# Patient Record
Sex: Female | Born: 1960
Health system: Southern US, Community
[De-identification: ages and names within clinical notes are randomized; demographics above are authoritative.]

## PROBLEM LIST (undated history)

## (undated) DIAGNOSIS — T7840XA Allergy, unspecified, initial encounter: Secondary | ICD-10-CM

## (undated) DIAGNOSIS — R011 Cardiac murmur, unspecified: Secondary | ICD-10-CM

## (undated) DIAGNOSIS — M1990 Unspecified osteoarthritis, unspecified site: Secondary | ICD-10-CM

## (undated) DIAGNOSIS — I10 Essential (primary) hypertension: Secondary | ICD-10-CM

## (undated) HISTORY — DX: Allergy, unspecified, initial encounter: T78.40XA

## (undated) HISTORY — DX: Unspecified osteoarthritis, unspecified site: M19.90

## (undated) HISTORY — DX: Cardiac murmur, unspecified: R01.1

## (undated) HISTORY — PX: ABDOMINAL HYSTERECTOMY: SHX81

## (undated) HISTORY — DX: Essential (primary) hypertension: I10

---

## 1992-11-06 HISTORY — PX: PARTIAL HYSTERECTOMY: SHX80

## 1993-11-06 HISTORY — PX: ABDOMINAL SURGERY: SHX537

## 1999-04-27 ENCOUNTER — Encounter: Payer: Self-pay | Admitting: Family Medicine

## 1999-04-27 ENCOUNTER — Ambulatory Visit (HOSPITAL_COMMUNITY): Admission: RE | Admit: 1999-04-27 | Discharge: 1999-04-27 | Payer: Self-pay | Admitting: Family Medicine

## 1999-05-31 ENCOUNTER — Other Ambulatory Visit: Admission: RE | Admit: 1999-05-31 | Discharge: 1999-05-31 | Payer: Self-pay | Admitting: *Deleted

## 2001-02-02 ENCOUNTER — Emergency Department (HOSPITAL_COMMUNITY): Admission: EM | Admit: 2001-02-02 | Discharge: 2001-02-03 | Payer: Self-pay | Admitting: Emergency Medicine

## 2001-02-03 ENCOUNTER — Encounter: Payer: Self-pay | Admitting: Emergency Medicine

## 2002-06-23 ENCOUNTER — Emergency Department (HOSPITAL_COMMUNITY): Admission: EM | Admit: 2002-06-23 | Discharge: 2002-06-23 | Payer: Self-pay | Admitting: Emergency Medicine

## 2002-06-23 ENCOUNTER — Encounter: Payer: Self-pay | Admitting: Emergency Medicine

## 2003-01-07 ENCOUNTER — Other Ambulatory Visit (HOSPITAL_COMMUNITY): Admission: RE | Admit: 2003-01-07 | Discharge: 2003-01-28 | Payer: Self-pay | Admitting: *Deleted

## 2003-12-31 ENCOUNTER — Emergency Department (HOSPITAL_COMMUNITY): Admission: EM | Admit: 2003-12-31 | Discharge: 2003-12-31 | Payer: Self-pay | Admitting: Family Medicine

## 2004-05-04 ENCOUNTER — Other Ambulatory Visit: Admission: RE | Admit: 2004-05-04 | Discharge: 2004-05-04 | Payer: Self-pay | Admitting: Family Medicine

## 2004-07-27 ENCOUNTER — Emergency Department (HOSPITAL_COMMUNITY): Admission: EM | Admit: 2004-07-27 | Discharge: 2004-07-27 | Payer: Self-pay | Admitting: Emergency Medicine

## 2004-11-06 HISTORY — PX: CERVICAL FUSION: SHX112

## 2005-04-28 ENCOUNTER — Ambulatory Visit (HOSPITAL_COMMUNITY): Admission: RE | Admit: 2005-04-28 | Discharge: 2005-04-29 | Payer: Self-pay | Admitting: Neurosurgery

## 2005-05-23 ENCOUNTER — Encounter: Admission: RE | Admit: 2005-05-23 | Discharge: 2005-05-23 | Payer: Self-pay | Admitting: Neurosurgery

## 2008-09-24 ENCOUNTER — Emergency Department (HOSPITAL_COMMUNITY): Admission: EM | Admit: 2008-09-24 | Discharge: 2008-09-24 | Payer: Self-pay | Admitting: Emergency Medicine

## 2008-11-06 HISTORY — PX: FOOT TUMOR EXCISION: SUR566

## 2009-02-11 ENCOUNTER — Emergency Department (HOSPITAL_COMMUNITY): Admission: EM | Admit: 2009-02-11 | Discharge: 2009-02-11 | Payer: Self-pay | Admitting: Family Medicine

## 2009-04-22 ENCOUNTER — Encounter: Admission: RE | Admit: 2009-04-22 | Discharge: 2009-04-22 | Payer: Self-pay | Admitting: Podiatry

## 2009-06-22 ENCOUNTER — Other Ambulatory Visit: Admission: RE | Admit: 2009-06-22 | Discharge: 2009-06-22 | Payer: Self-pay | Admitting: Family Medicine

## 2009-10-12 ENCOUNTER — Encounter: Admission: RE | Admit: 2009-10-12 | Discharge: 2009-10-12 | Payer: Self-pay | Admitting: Family Medicine

## 2011-01-15 ENCOUNTER — Emergency Department (HOSPITAL_BASED_OUTPATIENT_CLINIC_OR_DEPARTMENT_OTHER)
Admission: EM | Admit: 2011-01-15 | Discharge: 2011-01-15 | Disposition: A | Discharge status: Home / Self Care | Payer: 59 | Attending: Emergency Medicine | Admitting: Emergency Medicine

## 2011-01-15 DIAGNOSIS — F17200 Nicotine dependence, unspecified, uncomplicated: Secondary | ICD-10-CM | POA: Insufficient documentation

## 2011-01-15 DIAGNOSIS — K122 Cellulitis and abscess of mouth: Secondary | ICD-10-CM | POA: Insufficient documentation

## 2011-01-15 DIAGNOSIS — I10 Essential (primary) hypertension: Secondary | ICD-10-CM | POA: Insufficient documentation

## 2011-03-24 NOTE — Op Note (Signed)
NAME:  ITZELL, BENDAVID              ACCOUNT NO.:  [id number]   MEDICAL RECORD NO.:  [id number]          PATIENT TYPE:  OIB   LOCATION:  2864                         FACILITY:  [facility]   PHYSICIAN:  Reinaldo Meeker, M.D. DATE OF BIRTH:  01-22-1961   DATE OF PROCEDURE:  04/28/2005  DATE OF DISCHARGE:                                 OPERATIVE REPORT   PREOPERATIVE DIAGNOSIS:  Herniated disk at C3-4, C4-5, with spinal stenosis.   POSTOPERATIVE DIAGNOSIS:  Herniated disk at C3-4, C4-5, with spinal  stenosis.   PROCEDURE:  C3-4, C4-5 anterior cervical diskectomy with bone bank fusion,  followed by [employer] anterior cervical plating.   SURGEON:  Reinaldo Meeker, M.D.   ASSISTANT:  Donalee Citrin, M.D.   PROCEDURE IN DETAIL:  After being placed in a supine position in 10 pounds  of halter traction, the patient's neck was prepped and draped in the usual  sterile fashion.  Localizing fluoroscopy was used prior to incision to  identify the appropriate level.  A transverse incision was made in the right  anterior neck starting at the midline and headed toward the medial aspect of  the sternocleidomastoid muscle.  The platysma muscle was then incised  transversely.  The natural fascial plane between the strap muscles medially  and the sternocleidomastoid laterally was identified and followed down to  the anterior aspect of the cervical spine.  The longus colli muscles were  identified and split in the midline, dissected away bilaterally with Kitner  dissector and unipolar coagulation.  A self-retaining retractor was placed  for exposure, and the neck revealed approach to the appropriate levels.  Using a 15 blade, the annulus of the disk at C3-4 and C4-5 was incised.  Using pituitary rongeurs and curettes, approximately 90% of the disk  material was removed at both levels.  A high-speed drill was used to widen  the interspaces.  At this time the microscope was draped, brought into the  field and used for the remainder of the case.  Starting at C3-4 using  microdissection technique, the remainder of disk material down to the  posterior longitudinal ligament was removed.  The ligament was incised  transversely and the cut edge removed with the Kerrison punch.  Large  amounts of herniated disk material and bony overgrowth were removed to take  care of the patient's spinal stenosis at this level.  Proximal foraminal  decompression was carried out bilaterally.  At this time inspection was  carried out at this level for any evidence of residual compression, and none  was identified.  A similar procedure was then carried out at C4-5, once  again removing the remainder of the disk material down to the posterior  longitudinal ligament, removing it in a posterior fashion.  Once again  generous spinal and foraminal decompression was carried out until there was  no evidence of further compression.  At this time inspection was carried out  at both levels for any evidence of residual compression, and none could be  identified.  Large amounts of irrigation were carried out.  Any bleeding was  controlled  with bipolar coagulation and Gelfoam.  Measurements were taken  and two 6 mm bone bank plugs reconstituted.  After irrigating once more and  confirming hemostasis, the plug was impacted without difficulty.  Fluoroscopy showed it to be in good position.  An appropriate-length [employer] anterior cervical plate was then chosen.  Under fluoroscopic  guidance drill holes were placed, followed by placement of 14 mm screws x6.  These were placed good position, which was confirmed at the end with  fluoroscopy, which showed the plates, screws and plug to all be in good  position.  Again large amounts of irrigation were carried out, any bleeding  controlled with bipolar coagulation.  The wound was then closed using  interrupted Vicryl on the platysma muscle, inverted 5-0 PDS in the subcu   layer and Steri-Strips on the skin.  A sterile dressing and a soft collar  were applied, and the patient was extubated and taken to the recovery room  in stable condition.       ROK/MEDQ  D:  04/28/2005  T:  04/29/2005  Job:  [id number]

## 2011-08-08 LAB — CBC
MCHC: 33.7
MCV: 90.2
Platelets: 170
RDW: 13.8

## 2011-08-08 LAB — POCT I-STAT, CHEM 8
Calcium, Ion: 1.19
Chloride: 104
Glucose, Bld: 109 — ABNORMAL HIGH
Hemoglobin: 13.6
Potassium: 3.9
Sodium: 139

## 2011-08-08 LAB — DIFFERENTIAL
Basophils Absolute: 0
Basophils Relative: 0
Eosinophils Absolute: 0.1
Neutrophils Relative %: 72

## 2012-02-09 ENCOUNTER — Encounter: Payer: Self-pay | Admitting: Internal Medicine

## 2012-03-12 ENCOUNTER — Ambulatory Visit (AMBULATORY_SURGERY_CENTER): Payer: 59 | Admitting: *Deleted

## 2012-03-12 VITALS — Ht 66.5 in | Wt 225.0 lb

## 2012-03-12 DIAGNOSIS — Z1211 Encounter for screening for malignant neoplasm of colon: Secondary | ICD-10-CM

## 2012-03-12 MED ORDER — PEG-KCL-NACL-NASULF-NA ASC-C 100 G PO SOLR: ORAL | Status: DC

## 2012-03-13 ENCOUNTER — Encounter: Payer: Self-pay | Admitting: Internal Medicine

## 2012-03-26 ENCOUNTER — Encounter: Payer: Self-pay | Admitting: Internal Medicine

## 2012-03-26 ENCOUNTER — Ambulatory Visit (AMBULATORY_SURGERY_CENTER): Payer: 59 | Admitting: Internal Medicine

## 2012-03-26 VITALS — BP 126/77 | HR 70 | Temp 98.3°F | Resp 16 | Ht 66.0 in | Wt 225.0 lb

## 2012-03-26 DIAGNOSIS — D126 Benign neoplasm of colon, unspecified: Secondary | ICD-10-CM

## 2012-03-26 DIAGNOSIS — Z1211 Encounter for screening for malignant neoplasm of colon: Secondary | ICD-10-CM

## 2012-03-26 MED ORDER — SODIUM CHLORIDE 0.9 % IV SOLN: 500.0000 mL | INTRAVENOUS | Status: DC

## 2012-03-26 NOTE — Progress Notes (Signed)
Patient did not experience any of the following events: a burn prior to discharge; a fall within the facility; wrong site/side/patient/procedure/implant event; or a hospital transfer or hospital admission upon discharge from the facility. (G8907) Patient did not have preoperative order for IV antibiotic SSI prophylaxis. (G8918)  

## 2012-03-26 NOTE — Op Note (Signed)
[facility] [address] [place], [place]  [address]  COLONOSCOPY PROCEDURE REPORT  PATIENT:  Veronica Carter, Veronica Carter  MR#:  [id number] BIRTHDATE:  1960-11-26, 51 yrs. old  GENDER:  female ENDOSCOPIST:  Hedwig Morton. Juanda Chance, MD REF. BY:  Elias Else, M.D. PROCEDURE DATE:  03/26/2012 PROCEDURE:  Colonoscopy with biopsy ASA CLASS:  Class III INDICATIONS:  colorectal cancer screening, average risk MEDICATIONS:   MAC sedation, administered by CRNA, propofol (Diprivan) 300 mg  DESCRIPTION OF PROCEDURE:   After the risks and benefits and of the procedure were explained, informed consent was obtained. Digital rectal exam was performed and revealed no rectal masses. The LB CF-H180AL [id number] endoscope was introduced through the anus and advanced to the cecum, which was identified by the ileocecal valve.  The quality of the prep was good, using MoviPrep.  The instrument was then slowly withdrawn as the colon was fully examined. <<PROCEDUREIMAGES>>  FINDINGS:  A sessile polyp was found. 5 mm sessile poly distal from the ileocecal valve The polyp was removed using cold biopsy forceps (see image2 and image1).  This was otherwise a normal examination of the colon (see image7, image6, and image3). Retroflexed views in the rectum revealed no abnormalities.    The scope was then withdrawn from the patient and the procedure completed.  COMPLICATIONS:  None ENDOSCOPIC IMPRESSION: 1) Sessile polyp 2) Otherwise normal examination RECOMMENDATIONS: 1) Await pathology results 2) High fiber diet.  REPEAT EXAM:  In 10 year(s) for.  10 year recall if polyp hyperplastic, otherwise 5 year  ______________________________ Hedwig Morton. Juanda Chance, MD  CC:  n. eSIGNED:   Hedwig Morton. Aldena Worm at 03/26/2012 12:24 PM  Tommi Emery, [phone]

## 2012-03-26 NOTE — Progress Notes (Signed)
1214 Vomited small amount clear secretions pt. Suctioned, oxygen level decreased to 72%, oxygen increased to 5 liters and oxygen level increased to 100%  Above started after 1213 after abdominal pressure applied and pt. Repositioned to back.

## 2012-03-26 NOTE — Patient Instructions (Signed)

## 2012-03-27 ENCOUNTER — Telehealth: Payer: Self-pay | Admitting: *Deleted

## 2012-03-27 NOTE — Telephone Encounter (Signed)
  Follow up Call-  Call back number 03/26/2012  Post procedure Call Back phone  # [phone]  Permission to leave phone message Yes     Patient questions:  Do you have a fever, pain , or abdominal swelling? no Pain Score  0 *  Have you tolerated food without any problems? yes  Have you been able to return to your normal activities? yes  Do you have any questions about your discharge instructions: Diet   no Medications  no Follow up visit  no  Do you have questions or concerns about your Care? no  Actions: * If pain score is 4 or above: No action needed, pain <4.

## 2012-04-02 ENCOUNTER — Encounter: Payer: Self-pay | Admitting: Internal Medicine

## 2014-04-21 ENCOUNTER — Emergency Department (HOSPITAL_BASED_OUTPATIENT_CLINIC_OR_DEPARTMENT_OTHER): Payer: No Typology Code available for payment source

## 2014-04-21 ENCOUNTER — Encounter (HOSPITAL_BASED_OUTPATIENT_CLINIC_OR_DEPARTMENT_OTHER): Payer: Self-pay | Admitting: Emergency Medicine

## 2014-04-21 ENCOUNTER — Emergency Department (HOSPITAL_BASED_OUTPATIENT_CLINIC_OR_DEPARTMENT_OTHER)
Admission: EM | Admit: 2014-04-21 | Discharge: 2014-04-21 | Disposition: A | Discharge status: Home / Self Care | Payer: No Typology Code available for payment source | Attending: Emergency Medicine | Admitting: Emergency Medicine

## 2014-04-21 DIAGNOSIS — Z79899 Other long term (current) drug therapy: Secondary | ICD-10-CM | POA: Insufficient documentation

## 2014-04-21 DIAGNOSIS — I10 Essential (primary) hypertension: Secondary | ICD-10-CM | POA: Insufficient documentation

## 2014-04-21 DIAGNOSIS — R011 Cardiac murmur, unspecified: Secondary | ICD-10-CM | POA: Insufficient documentation

## 2014-04-21 DIAGNOSIS — J069 Acute upper respiratory infection, unspecified: Secondary | ICD-10-CM | POA: Insufficient documentation

## 2014-04-21 DIAGNOSIS — J40 Bronchitis, not specified as acute or chronic: Secondary | ICD-10-CM

## 2014-04-21 DIAGNOSIS — F17200 Nicotine dependence, unspecified, uncomplicated: Secondary | ICD-10-CM | POA: Insufficient documentation

## 2014-04-21 DIAGNOSIS — J209 Acute bronchitis, unspecified: Secondary | ICD-10-CM | POA: Insufficient documentation

## 2014-04-21 MED ORDER — DOXYCYCLINE HYCLATE 100 MG PO CAPS: 100.0000 mg | ORAL_CAPSULE | Freq: Two times a day (BID) | ORAL | Status: DC

## 2014-04-21 MED ORDER — BENZONATATE 200 MG PO CAPS: 200.0000 mg | ORAL_CAPSULE | Freq: Two times a day (BID) | ORAL | Status: DC | PRN

## 2014-04-21 MED ORDER — IBUPROFEN 800 MG PO TABS
800.0000 mg | ORAL_TABLET | Freq: Once | ORAL | Status: AC
  Administered 2014-04-21: 800 mg via ORAL
  Filled 2014-04-21: qty 1

## 2014-04-21 MED ORDER — IBUPROFEN 800 MG PO TABS: 800.0000 mg | ORAL_TABLET | Freq: Three times a day (TID) | ORAL | Status: AC | PRN

## 2014-04-21 MED ORDER — METOCLOPRAMIDE HCL 10 MG PO TABS
10.0000 mg | ORAL_TABLET | Freq: Once | ORAL | Status: AC
  Administered 2014-04-21: 10 mg via ORAL
  Filled 2014-04-21: qty 1

## 2014-04-21 MED ORDER — BENZONATATE 100 MG PO CAPS
200.0000 mg | ORAL_CAPSULE | Freq: Once | ORAL | Status: AC
  Administered 2014-04-21: 200 mg via ORAL
  Filled 2014-04-21: qty 2

## 2014-04-21 NOTE — ED Provider Notes (Signed)
CSN: [id number]     Arrival date & time 04/21/14  0841 History   First MD Initiated Contact with Patient 04/21/14 [phone]     Chief Complaint  Patient presents with  . URI     (Consider location/radiation/quality/duration/timing/severity/associated sxs/prior Treatment) Patient is a 53 y.o. female presenting with URI. The history is provided by the patient.  URI Presenting symptoms: congestion, cough and rhinorrhea   Presenting symptoms: no fever and no sore throat   Congestion:    Location:  Nasal and chest Cough:    Cough characteristics:  Non-productive   Severity:  Moderate   Onset quality:  Gradual   Duration:  3 weeks   Timing:  Constant   Progression:  Worsening Severity:  Mild Onset quality:  Gradual Duration:  3 weeks Timing:  Intermittent Progression:  Unchanged Chronicity:  New Relieved by:  Nothing Ineffective treatments: theraflu, home remedies. Associated symptoms: no wheezing   Risk factors: sick contacts (taking care of someone with pneumonia)     Past Medical History  Diagnosis Date  . Allergy     dust,pollen, grass, molds  . Heart murmur   . Hypertension    Past Surgical History  Procedure Laterality Date  . Partial hysterectomy  1994    Has one ovary  . Abdominal surgery  1995    to remove a benign tumor  . Cervical fusion  2006  . Foot tumor excision  2010    benign left  . Cesarean section  1978 &1984   History reviewed. No pertinent family history. History  Substance Use Topics  . Smoking status: Current Every Day Smoker -- 1.00 packs/day    Types: Cigarettes  . Smokeless tobacco: Never Used  . Alcohol Use: Yes     Comment: 1-2 a month   OB History   Grav Para Term Preterm Abortions TAB SAB Ect Mult Living                 Review of Systems  Constitutional: Negative for fever.  HENT: Positive for congestion and rhinorrhea. Negative for sore throat.   Respiratory: Positive for cough. Negative for shortness of breath and wheezing.    All other systems reviewed and are negative.     Allergies  Sulfa antibiotics; Codeine; Hydrocodone; Morphine and related; Tramadol; Tylenol; and Demerol  Home Medications   Prior to Admission medications   Medication Sig Start Date End Date Taking? Authorizing Provider  hydrochlorothiazide (MICROZIDE) 12.5 MG capsule Take 1 tablet by mouth Daily. 03/04/12   Historical Provider, MD   BP 161/85  Pulse 66  Temp(Src) 98.3 F (36.8 C) (Oral)  SpO2 100% Physical Exam  Nursing note and vitals reviewed. Constitutional: She is oriented to person, place, and time. She appears well-developed and well-nourished. No distress.  HENT:  Head: Normocephalic and atraumatic.  Right Ear: Tympanic membrane normal.  Left Ear: Tympanic membrane normal.  Mouth/Throat: Oropharynx is clear and moist. No oropharyngeal exudate.  Eyes: EOM are normal. Pupils are equal, round, and reactive to light.  Neck: Normal range of motion. Neck supple.  Cardiovascular: Normal rate and regular rhythm.  Exam reveals no friction rub.   No murmur heard. Pulmonary/Chest: Effort normal and breath sounds normal. No respiratory distress. She has no wheezes. She has no rales. She exhibits no tenderness.  Abdominal: Soft. She exhibits no distension. There is no tenderness. There is no rebound.  Musculoskeletal: Normal range of motion. She exhibits no edema.  Neurological: She is alert and oriented to  person, place, and time.  Skin: She is not diaphoretic.    ED Course  Procedures (including critical care time) Labs Review Labs Reviewed - No data to display  Imaging Review Dg Chest 2 View  04/21/2014   CLINICAL DATA:  Cough, congestion and sinus pressure. Mid chest pain for the past 3 weeks.  EXAM: CHEST  2 VIEW  COMPARISON:  No priors.  FINDINGS: Lung volumes are normal. No consolidative airspace disease. No pleural effusions. No pneumothorax. No pulmonary nodule or mass noted. Pulmonary vasculature and the  cardiomediastinal silhouette are within normal limits. Orthopedic fixation hardware noted in the lower cervical spine.  IMPRESSION: No radiographic evidence of acute cardiopulmonary disease.   Electronically Signed   By: Vinnie Langton M.D.   On: 04/21/2014 09:43     EKG Interpretation   Date/Time:  Tuesday April 21 2014 09:37:25 EDT Ventricular Rate:  60 PR Interval:  220 QRS Duration: 102 QT Interval:  402 QTC Calculation: 402 R Axis:   20 Text Interpretation:  Sinus rhythm with 1st degree A-V block Septal  infarct , age undetermined No prior for comparison Confirmed by [facility]   MD, BLAIR ([id number]) on 04/21/2014 9:43:16 AM      MDM   Final diagnoses:  Bronchitis  URI (upper respiratory infection)    55F with hx of smoking, HTN presents with 3 weeks of URI symptoms. Congestion, rhinorrhea, cough. Nonproductive cough, but feels like something is stuck in her chest. No difficulty with breathing, no wheezing. Chest pain gradually worsened over the 3 weeks with her coughing. Coughing my on exam, otherwise vitals stable, lungs clear. Denies trying NSAIDs, only tried aleve PM, which made her groggy and didn't help. She also tried theraflu and hmoe remedies without relief. Will xray, given motrin and reglan for her headache, bodyaches.  CXR negative. Given doxycycline for bronchitis, motrin for headaches, tessalon for cough. Advised on not allowing children to having tessalon. Instructed to pursue PCP f/u.  Osvaldo Shipper, MD 04/21/14 [phone]

## 2014-04-21 NOTE — ED Notes (Signed)
Pt with cough, headache, nasal congestion, neck soreness for three weeks without any fever.

## 2014-04-21 NOTE — Discharge Instructions (Signed)

## 2015-11-29 ENCOUNTER — Encounter (HOSPITAL_COMMUNITY): Payer: Self-pay | Admitting: Emergency Medicine

## 2015-11-29 ENCOUNTER — Emergency Department (INDEPENDENT_AMBULATORY_CARE_PROVIDER_SITE_OTHER)
Admission: EM | Admit: 2015-11-29 | Discharge: 2015-11-29 | Disposition: A | Discharge status: Home / Self Care | Payer: PRIVATE HEALTH INSURANCE | Source: Home / Self Care | Attending: Family Medicine | Admitting: Family Medicine

## 2015-11-29 DIAGNOSIS — E669 Obesity, unspecified: Secondary | ICD-10-CM

## 2015-11-29 DIAGNOSIS — R609 Edema, unspecified: Secondary | ICD-10-CM

## 2015-11-29 DIAGNOSIS — I10 Essential (primary) hypertension: Secondary | ICD-10-CM

## 2015-11-29 DIAGNOSIS — Z720 Tobacco use: Secondary | ICD-10-CM | POA: Diagnosis not present

## 2015-11-29 DIAGNOSIS — Z760 Encounter for issue of repeat prescription: Secondary | ICD-10-CM

## 2015-11-29 LAB — POCT I-STAT, CHEM 8
BUN: 9 mg/dL (ref 6–20)
CALCIUM ION: 1.18 mmol/L (ref 1.12–1.23)
CREATININE: 0.7 mg/dL (ref 0.44–1.00)
Chloride: 103 mmol/L (ref 101–111)
GLUCOSE: 100 mg/dL — AB (ref 65–99)
HCT: 47 % — ABNORMAL HIGH (ref 36.0–46.0)
HEMOGLOBIN: 16 g/dL — AB (ref 12.0–15.0)
POTASSIUM: 3.9 mmol/L (ref 3.5–5.1)
Sodium: 142 mmol/L (ref 135–145)
TCO2: 26 mmol/L (ref 0–100)

## 2015-11-29 MED ORDER — HYDROCHLOROTHIAZIDE 25 MG PO TABS: 25.0000 mg | ORAL_TABLET | Freq: Every day | ORAL | Status: AC

## 2015-11-29 NOTE — ED Notes (Signed)
Patient reports being out of medicine for two months.  Patient lost insurance.  Recently obtained insurance again.  Patient has noticed swelling in bilateral legs.  Denies chest pain.

## 2015-11-29 NOTE — ED Notes (Signed)
Patient's phlebotomy site was bleeding.  Removed dressing and applied a new dressing, secured with transpore tape.  Site has stopped bleeding.  Slight bruising around site.

## 2015-11-29 NOTE — Discharge Instructions (Signed)
There is a risk of taking medications prescribed by a health care provider without obtaining a complete history, labwork or proper physical exam, etc. This cannot be completed adequately at an urgent care. There can be multiple problems, some serious,  associated with medications and undetermined conditions of your health status. This action is performed as a last resort in order to supply you with medication. By receiving these prescriptions you are ackowleging and accepting these risks and will not hold the prescriber or any agent of [facility] as responsible for any adverse outcomes.    Medicine Refill at the Urgent Care We have refilled your medicine today, but it is best for you to get refills through your primary health care provider's office. In the future, please plan ahead so you do not need to get refills from the emergency department or Urgent Care. If the medicine we refilled was a maintenance medicine, you may have received only enough to get you by until you are able to see your regular health care provider.   This information is not intended to replace advice given to you by your health care provider. Make sure you discuss any questions you have with your health care provider.   Document Released: 02/09/2004 Document Revised: 11/13/2014 Document Reviewed: 01/30/2014 Elsevier Interactive Patient Education 2016 [employer].     Hypertension Hypertension, commonly called high blood pressure, is when the force of blood pumping through your arteries is too strong. Your arteries are the blood vessels that carry blood from your heart throughout your body. A blood pressure reading consists of a higher number over a lower number, such as 110/72. The higher number (systolic) is the pressure inside your arteries when your heart pumps. The lower number (diastolic) is the pressure inside your arteries when your heart relaxes. Ideally you want your blood pressure  below 120/80. Hypertension forces your heart to work harder to pump blood. Your arteries may become narrow or stiff. Having untreated or uncontrolled hypertension can cause heart attack, stroke, kidney disease, and other problems. RISK FACTORS Some risk factors for high blood pressure are controllable. Others are not.  Risk factors you cannot control include:   Race. You may be at higher risk if you are African American.  Age. Risk increases with age.  Gender. Men are at higher risk than women before age 93 years. After age 45, women are at higher risk than men. Risk factors you can control include:  Not getting enough exercise or physical activity.  Being overweight.  Getting too much fat, sugar, calories, or salt in your diet.  Drinking too much alcohol. SIGNS AND SYMPTOMS Hypertension does not usually cause signs or symptoms. Extremely high blood pressure (hypertensive crisis) may cause headache, anxiety, shortness of breath, and nosebleed. DIAGNOSIS To check if you have hypertension, your health care provider will measure your blood pressure while you are seated, with your arm held at the level of your heart. It should be measured at least twice using the same arm. Certain conditions can cause a difference in blood pressure between your right and left arms. A blood pressure reading that is higher than normal on one occasion does not mean that you need treatment. If it is not clear whether you have high blood pressure, you may be asked to return on a different day to have your blood pressure checked again. Or, you may be asked to monitor your blood pressure at home for 1 or more weeks.  TREATMENT Treating high blood pressure includes making lifestyle changes and possibly taking medicine. Living a healthy lifestyle can help lower high blood pressure. You may need to change some of your habits. Lifestyle changes may include:  Following the DASH diet. This diet is high in fruits,  vegetables, and whole grains. It is low in salt, red meat, and added sugars.  Keep your sodium intake below 2,300 mg per day.  Getting at least 30-45 minutes of aerobic exercise at least 4 times per week.  Losing weight if necessary.  Not smoking.  Limiting alcoholic beverages.  Learning ways to reduce stress. Your health care provider may prescribe medicine if lifestyle changes are not enough to get your blood pressure under control, and if one of the following is true:  You are 76-28 years of age and your systolic blood pressure is above 140.  You are 20 years of age or older, and your systolic blood pressure is above 150.  Your diastolic blood pressure is above 90.  You have diabetes, and your systolic blood pressure is over [id number] or your diastolic blood pressure is over 90.  You have kidney disease and your blood pressure is above 140/90.  You have heart disease and your blood pressure is above 140/90. Your personal target blood pressure may vary depending on your medical conditions, your age, and other factors. HOME CARE INSTRUCTIONS  Have your blood pressure rechecked as directed by your health care provider.   Take medicines only as directed by your health care provider. Follow the directions carefully. Blood pressure medicines must be taken as prescribed. The medicine does not work as well when you skip doses. Skipping doses also puts you at risk for problems.  Do not smoke.   Monitor your blood pressure at home as directed by your health care provider. SEEK MEDICAL CARE IF:   You think you are having a reaction to medicines taken.  You have recurrent headaches or feel dizzy.  You have swelling in your ankles.  You have trouble with your vision. SEEK IMMEDIATE MEDICAL CARE IF:  You develop a severe headache or confusion.  You have unusual weakness, numbness, or feel faint.  You have severe chest or abdominal pain.  You vomit repeatedly.  You have  trouble breathing. MAKE SURE YOU:   Understand these instructions.  Will watch your condition.  Will get help right away if you are not doing well or get worse.   This information is not intended to replace advice given to you by your health care provider. Make sure you discuss any questions you have with your health care provider.   Document Released: 10/23/2005 Document Revised: 03/09/2015 Document Reviewed: 08/15/2013 Elsevier Interactive Patient Education 2016 Elsevier Inc.   Peripheral Edema You have swelling in your legs (peripheral edema). This swelling is due to excess accumulation of salt and water in your body. Edema may be a sign of heart, kidney or liver disease, or a side effect of a medication. It may also be due to problems in the leg veins. Elevating your legs and using special support stockings may be very helpful, if the cause of the swelling is due to poor venous circulation. Avoid long periods of standing, whatever the cause. Treatment of edema depends on identifying the cause. Chips, pretzels, pickles and other salty foods should be avoided. Restricting salt in your diet is almost always needed. Water pills (diuretics) are often used to remove the excess salt and water from your body via urine.  These medicines prevent the kidney from reabsorbing sodium. This increases urine flow. Diuretic treatment may also result in lowering of potassium levels in your body. Potassium supplements may be needed if you have to use diuretics daily. Daily weights can help you keep track of your progress in clearing your edema. You should call your caregiver for follow up care as recommended. SEEK IMMEDIATE MEDICAL CARE IF:   You have increased swelling, pain, redness, or heat in your legs.  You develop shortness of breath, especially when lying down.  You develop chest or abdominal pain, weakness, or fainting.  You have a fever.   This information is not intended to replace advice  given to you by your health care provider. Make sure you discuss any questions you have with your health care provider.   Document Released: 11/30/2004 Document Revised: 01/15/2012 Document Reviewed: 05/05/2015 Elsevier Interactive Patient Education 2016 [employer].  Smoking Hazards Smoking cigarettes is extremely bad for your health. Tobacco smoke has over 200 known poisons in it. It contains the poisonous gases nitrogen oxide and carbon monoxide. There are over 60 chemicals in tobacco smoke that cause cancer. Some of the chemicals found in cigarette smoke include:   Cyanide.   Benzene.   Formaldehyde.   Methanol (wood alcohol).   Acetylene (fuel used in welding torches).   Ammonia.  Even smoking lightly shortens your life expectancy by several years. You can greatly reduce the risk of medical problems for you and your family by stopping now. Smoking is the most preventable cause of death and disease in our society. Within days of quitting smoking, your circulation improves, you decrease the risk of having a heart attack, and your lung capacity improves. There may be some increased phlegm in the first few days after quitting, and it may take months for your lungs to clear up completely. Quitting for 10 years reduces your risk of developing lung cancer to almost that of a nonsmoker.  WHAT ARE THE RISKS OF SMOKING? Cigarette smokers have an increased risk of many serious medical problems, including:  Lung cancer.   Lung disease (such as pneumonia, bronchitis, and emphysema).   Heart attack and chest pain due to the heart not getting enough oxygen (angina).   Heart disease and peripheral blood vessel disease.   Hypertension.   Stroke.   Oral cancer (cancer of the lip, mouth, or voice box).   Bladder cancer.   Pancreatic cancer.   Cervical cancer.   Pregnancy complications, including premature birth.   Stillbirths and smaller newborn babies, birth  defects, and genetic damage to sperm.   Early menopause.   Lower estrogen level for women.   Infertility.   Facial wrinkles.   Blindness.   Increased risk of broken bones (fractures).   Senile dementia.   Stomach ulcers and internal bleeding.   Delayed wound healing and increased risk of complications during surgery. Because of secondhand smoke exposure, children of smokers have an increased risk of the following:   Sudden infant death syndrome (SIDS).   Respiratory infections.   Lung cancer.   Heart disease.   Ear infections.  WHY IS SMOKING ADDICTIVE? Nicotine is the chemical agent in tobacco that is capable of causing addiction or dependence. When you smoke and inhale, nicotine is absorbed rapidly into the bloodstream through your lungs. Both inhaled and noninhaled nicotine may be addictive.  WHAT ARE THE BENEFITS OF QUITTING?  There are many health benefits to quitting smoking. Some are:   The likelihood of  developing cancer and heart disease decreases. Health improvements are seen almost immediately.   Blood pressure, pulse rate, and breathing patterns start returning to normal soon after quitting.   People who quit may see an improvement in their overall quality of life.  HOW DO YOU QUIT SMOKING? Smoking is an addiction with both physical and psychological effects, and longtime habits can be hard to change. Your health care provider can recommend:  Programs and community resources, which may include group support, education, or therapy.  Replacement products, such as patches, gum, and nasal sprays. Use these products only as directed. Do not replace cigarette smoking with electronic cigarettes (commonly called e-cigarettes). The safety of e-cigarettes is unknown, and some may contain harmful chemicals. FOR MORE INFORMATION  American Lung Association: www.lung.org  American Cancer Society: www.cancer.org   This information is not intended to  replace advice given to you by your health care provider. Make sure you discuss any questions you have with your health care provider.   Document Released: 11/30/2004 Document Revised: 08/13/2013 Document Reviewed: 04/14/2013 Elsevier Interactive Patient Education 2016 [place] Your High Blood Pressure Blood pressure is a measurement of how forceful your blood is pressing against the walls of the arteries. Arteries are muscular tubes within the circulatory system. Blood pressure does not stay the same. Blood pressure rises when you are active, excited, or nervous; and it lowers during sleep and relaxation. If the numbers measuring your blood pressure stay above normal most of the time, you are at risk for health problems. High blood pressure (hypertension) is a long-term (chronic) condition in which blood pressure is elevated. A blood pressure reading is recorded as two numbers, such as 120 over 80 (or 120/80). The first, higher number is called the systolic pressure. It is a measure of the pressure in your arteries as the heart beats. The second, lower number is called the diastolic pressure. It is a measure of the pressure in your arteries as the heart relaxes between beats.  Keeping your blood pressure in a normal range is important to your overall health and prevention of health problems, such as heart disease and stroke. When your blood pressure is uncontrolled, your heart has to work harder than normal. High blood pressure is a very common condition in adults because blood pressure tends to rise with age. Men and women are equally likely to have hypertension but at different times in life. Before age 76, men are more likely to have hypertension. After 56 years of age, women are more likely to have it. Hypertension is especially common in African Americans. This condition often has no signs or symptoms. The cause of the condition is usually not known. Your caregiver can help you come up  with a plan to keep your blood pressure in a normal, healthy range. BLOOD PRESSURE STAGES Blood pressure is classified into four stages: normal, prehypertension, stage 1, and stage 2. Your blood pressure reading will be used to determine what type of treatment, if any, is necessary. Appropriate treatment options are tied to these four stages:  Normal  Systolic pressure (mm Hg): below 120.  Diastolic pressure (mm Hg): below 80. Prehypertension  Systolic pressure (mm Hg): 120 to 139.  Diastolic pressure (mm Hg): 80 to 89. Stage1  Systolic pressure (mm Hg): 140 to 159.  Diastolic pressure (mm Hg): 90 to 99. Stage2  Systolic pressure (mm Hg): 160 or above.  Diastolic pressure (mm Hg): 100 or above. RISKS RELATED TO HIGH BLOOD PRESSURE  Managing your blood pressure is an important responsibility. Uncontrolled high blood pressure can lead to:  A heart attack.  A stroke.  A weakened blood vessel (aneurysm).  Heart failure.  Kidney damage.  Eye damage.  Metabolic syndrome.  Memory and concentration problems. HOW TO MANAGE YOUR BLOOD PRESSURE Blood pressure can be managed effectively with lifestyle changes and medicines (if needed). Your caregiver will help you come up with a plan to bring your blood pressure within a normal range. Your plan should include the following: Education  Read all information provided by your caregivers about how to control blood pressure.  Educate yourself on the latest guidelines and treatment recommendations. New research is always being done to further define the risks and treatments for high blood pressure. Lifestylechanges  Control your weight.  Avoid smoking.  Stay physically active.  Reduce the amount of salt in your diet.  Reduce stress.  Control any chronic conditions, such as high cholesterol or diabetes.  Reduce your alcohol intake. Medicines  Several medicines (antihypertensive medicines) are available, if needed, to  bring blood pressure within a normal range. Communication  Review all the medicines you take with your caregiver because there may be side effects or interactions.  Talk with your caregiver about your diet, exercise habits, and other lifestyle factors that may be contributing to high blood pressure.  See your caregiver regularly. Your caregiver can help you create and adjust your plan for managing high blood pressure. RECOMMENDATIONS FOR TREATMENT AND FOLLOW-UP  The following recommendations are based on current guidelines for managing high blood pressure in nonpregnant adults. Use these recommendations to identify the proper follow-up period or treatment option based on your blood pressure reading. You can discuss these options with your caregiver.  Systolic pressure of 123456 to [id number] or diastolic pressure of 80 to 89: Follow up with your caregiver as directed.  Systolic pressure of [id number] to 0000000 or diastolic pressure of 90 to 100: Follow up with your caregiver within 2 months.  Systolic pressure above 0000000 or diastolic pressure above [id number]: Follow up with your caregiver within 1 month.  Systolic pressure above [id number] or diastolic pressure above [id number]: Consider antihypertensive therapy; follow up with your caregiver within 1 week.  Systolic pressure above [id number] or diastolic pressure above 123456: Begin antihypertensive therapy; follow up with your caregiver within 1 week.   This information is not intended to replace advice given to you by your health care provider. Make sure you discuss any questions you have with your health care provider.   Document Released: 07/17/2012 Document Reviewed: 07/17/2012 Elsevier Interactive Patient Education [employer].

## 2015-11-29 NOTE — ED Provider Notes (Signed)
CSN: [id number]     Arrival date & time 11/29/15  1259 History   First MD Initiated Contact with Patient 11/29/15 1327     Chief Complaint  Patient presents with  . Medication Refill   (Consider location/radiation/quality/duration/timing/severity/associated sxs/prior Treatment) HPI Comments: 55 year old obese female with a history of hypertension for 10 years presents to the urgent care for refill of her hydrochlorothiazide medication. She has not taken it since early December 2016. At that time she had to stop seeing her PCP due to loss of insurance. Since she has stopped taking the medication she has developed peripheral edema. She denies chest pain or shortness of breath. She continues to smoke one pack per day. She has a 44-pack-year history.   Past Medical History  Diagnosis Date  . Allergy     dust,pollen, grass, molds  . Heart murmur   . Hypertension    Past Surgical History  Procedure Laterality Date  . Partial hysterectomy  1994    Has one ovary  . Abdominal surgery  1995    to remove a benign tumor  . Cervical fusion  2006  . Foot tumor excision  2010    benign left  . Cesarean section  1978 &1984   No family history on file. Social History  Substance Use Topics  . Smoking status: Current Every Day Smoker -- 1.00 packs/day    Types: Cigarettes  . Smokeless tobacco: Never Used  . Alcohol Use: Yes     Comment: 1-2 a month   OB History    No data available     Review of Systems  Constitutional: Negative.   Respiratory: Negative.   Cardiovascular: Positive for leg swelling. Negative for chest pain and palpitations.  Gastrointestinal: Negative.   Musculoskeletal: Negative.   Neurological: Negative.     Allergies  Sulfa antibiotics; Codeine; Hydrocodone; Morphine and related; Tramadol; Tylenol; and Demerol  Home Medications   Prior to Admission medications   Medication Sig Start Date End Date Taking? Authorizing Provider  benzonatate (TESSALON) 200 MG  capsule Take 1 capsule (200 mg total) by mouth 2 (two) times daily as needed for cough. Patient not taking: Reported on 11/29/2015 04/21/14   [name], MD  doxycycline (VIBRAMYCIN) 100 MG capsule Take 1 capsule (100 mg total) by mouth 2 (two) times daily. One po bid x 7 days Patient not taking: Reported on 11/29/2015 04/21/14   [name], MD  hydrochlorothiazide (HYDRODIURIL) 25 MG tablet Take 1 tablet (25 mg total) by mouth daily. 11/29/15   [name], NP  ibuprofen (ADVIL,MOTRIN) 800 MG tablet Take 1 tablet (800 mg total) by mouth every 8 (eight) hours as needed for headache or moderate pain. 04/21/14   [name], MD   Meds Ordered and Administered this Visit  Medications - No data to display  BP 153/93 mmHg  Pulse 68  Temp(Src) 97.8 F (36.6 C) (Oral)  Resp 20  SpO2 97% No data found.   Physical Exam  Constitutional: She is oriented to person, place, and time. She appears well-developed and well-nourished. No distress.  Eyes: EOM are normal.  Neck: Normal range of motion. Neck supple.  Cardiovascular: Normal rate, regular rhythm, normal heart sounds and intact distal pulses.   Pulmonary/Chest: Effort normal and breath sounds normal. No respiratory distress. She has no wheezes.  Musculoskeletal: She exhibits edema.  2+ pitting edema to the lower extremities.  Neurological: She is alert and oriented to person, place, and time. No cranial nerve deficit. She exhibits  normal muscle tone.  Skin: Skin is warm and dry. No rash noted.  Psychiatric: She has a normal mood and affect.  Nursing note and vitals reviewed.   ED Course  Procedures (including critical care time)  Labs Review Labs Reviewed  POCT I-STAT, CHEM 8 - Abnormal; Notable for the following:    Glucose, Bld 100 (*)    Hemoglobin 16.0 (*)    HCT 47.0 (*)    All other components within normal limits   Results for orders placed or performed during the hospital encounter of 11/29/15  I-STAT, chem 8  Result Value  Ref Range   Sodium 142 135 - 145 mmol/L   Potassium 3.9 3.5 - 5.1 mmol/L   Chloride 103 101 - 111 mmol/L   BUN 9 6 - 20 mg/dL   Creatinine, Ser 0.70 0.44 - 1.00 mg/dL   Glucose, Bld 100 (H) 65 - 99 mg/dL   Calcium, Ion 1.18 1.12 - 1.23 mmol/L   TCO2 26 0 - 100 mmol/L   Hemoglobin 16.0 (H) 12.0 - 15.0 g/dL   HCT 47.0 (H) 36.0 - 46.0 %     Imaging Review No results found.   Visual Acuity Review  Right Eye Distance:   Left Eye Distance:   Bilateral Distance:    Right Eye Near:   Left Eye Near:    Bilateral Near:         MDM   1. Essential hypertension   2. Peripheral edema   3. Obesity   4. Tobacco abuse disorder   5. Encounter for medication refill    Meds ordered this encounter  Medications  . hydrochlorothiazide (HYDRODIURIL) 25 MG tablet    Sig: Take 1 tablet (25 mg total) by mouth daily.    Dispense:  30 tablet    Refill:  0    Order Specific Question:  Supervising Provider    Answer:  Ihor Gully D [phone]  There is a risk of taking medications prescribed by a health care provider without obtaining a complete history, labwork or proper physical exam, etc. This cannot be completed adequately at an urgent care. There can be multiple problems, some serious,  associated with medications and undetermined conditions of your health status. This action is performed as a last resort in order to supply you with medication. By receiving these prescriptions you are ackowleging and accepting these risks and will not hold the prescriber or any agent of [facility] as responsible for any adverse outcomes.    Medicine Refill at the Urgent Care We have refilled your medicine today, but it is best for you to get refills through your primary health care provider's office. In the future, please plan ahead so you do not need to get refills from the emergency department or Urgent Care. If the medicine we refilled was a maintenance medicine, you may  have received only enough to get you by until you are able to see your regular health care provider.   This information is not intended to replace advice given to you by your health care provider. Make sure you discuss any questions you have with your health care provider.   Document Released: 02/09/2004 Document Revised: 11/13/2014 Document Reviewed: 01/30/2014 Elsevier Interactive Patient Education 2016 [place], [place] 11/29/15 1434

## 2015-11-30 ENCOUNTER — Emergency Department (HOSPITAL_COMMUNITY): Payer: PRIVATE HEALTH INSURANCE

## 2015-11-30 ENCOUNTER — Encounter (HOSPITAL_COMMUNITY): Payer: Self-pay

## 2015-11-30 ENCOUNTER — Emergency Department (HOSPITAL_COMMUNITY)
Admission: EM | Admit: 2015-11-30 | Discharge: 2015-11-30 | Discharge status: Against Medical Advice | Payer: PRIVATE HEALTH INSURANCE | Attending: Emergency Medicine | Admitting: Emergency Medicine

## 2015-11-30 DIAGNOSIS — R079 Chest pain, unspecified: Secondary | ICD-10-CM | POA: Insufficient documentation

## 2015-11-30 DIAGNOSIS — I10 Essential (primary) hypertension: Secondary | ICD-10-CM | POA: Diagnosis not present

## 2015-11-30 DIAGNOSIS — R011 Cardiac murmur, unspecified: Secondary | ICD-10-CM | POA: Insufficient documentation

## 2015-11-30 DIAGNOSIS — R51 Headache: Secondary | ICD-10-CM | POA: Diagnosis not present

## 2015-11-30 DIAGNOSIS — R0602 Shortness of breath: Secondary | ICD-10-CM | POA: Diagnosis not present

## 2015-11-30 DIAGNOSIS — F17210 Nicotine dependence, cigarettes, uncomplicated: Secondary | ICD-10-CM | POA: Diagnosis not present

## 2015-11-30 LAB — CBC
HEMATOCRIT: 43.6 % (ref 36.0–46.0)
HEMOGLOBIN: 14.3 g/dL (ref 12.0–15.0)
MCH: 29.1 pg (ref 26.0–34.0)
MCHC: 32.8 g/dL (ref 30.0–36.0)
MCV: 88.6 fL (ref 78.0–100.0)
Platelets: 222 10*3/uL (ref 150–400)
RBC: 4.92 MIL/uL (ref 3.87–5.11)
RDW: 13.9 % (ref 11.5–15.5)
WBC: 8 10*3/uL (ref 4.0–10.5)

## 2015-11-30 LAB — BASIC METABOLIC PANEL
Anion gap: 10 (ref 5–15)
BUN: 8 mg/dL (ref 6–20)
CO2: 27 mmol/L (ref 22–32)
Calcium: 9.7 mg/dL (ref 8.9–10.3)
Chloride: 103 mmol/L (ref 101–111)
Creatinine, Ser: 0.71 mg/dL (ref 0.44–1.00)
GFR calc Af Amer: >60 mL/min (ref >60)
GFR calc non Af Amer: >60 mL/min (ref >60)
GLUCOSE: 103 mg/dL — AB (ref 65–99)
POTASSIUM: 3.8 mmol/L (ref 3.5–5.1)
Sodium: 140 mmol/L (ref 135–145)

## 2015-11-30 LAB — I-STAT TROPONIN, ED: Troponin i, poc: 0 ng/mL (ref 0.00–0.08)

## 2015-11-30 NOTE — ED Notes (Signed)
Pt reports onset 2-3 days ago mid chest pain that feels tight and right chest pain that feels more like breast pain.  Onset last night pressure across forehead directly above eyebrows.  Pt re-started BP medication yesterday after being off x 2 months.  C/o mild shortness of breath.  Denies diaphoresis.

## 2015-11-30 NOTE — ED Notes (Signed)
Pt states she doesn't wish to stay any longer.

## 2016-06-07 ENCOUNTER — Emergency Department (HOSPITAL_BASED_OUTPATIENT_CLINIC_OR_DEPARTMENT_OTHER)
Admission: EM | Admit: 2016-06-07 | Discharge: 2016-06-07 | Disposition: A | Discharge status: Home / Self Care | Payer: Managed Care, Other (non HMO) | Attending: Physician Assistant | Admitting: Physician Assistant

## 2016-06-07 ENCOUNTER — Encounter (HOSPITAL_BASED_OUTPATIENT_CLINIC_OR_DEPARTMENT_OTHER): Payer: Self-pay

## 2016-06-07 ENCOUNTER — Emergency Department (HOSPITAL_BASED_OUTPATIENT_CLINIC_OR_DEPARTMENT_OTHER): Payer: Managed Care, Other (non HMO)

## 2016-06-07 DIAGNOSIS — R0789 Other chest pain: Secondary | ICD-10-CM

## 2016-06-07 DIAGNOSIS — R0982 Postnasal drip: Secondary | ICD-10-CM | POA: Insufficient documentation

## 2016-06-07 DIAGNOSIS — R011 Cardiac murmur, unspecified: Secondary | ICD-10-CM | POA: Insufficient documentation

## 2016-06-07 DIAGNOSIS — R0602 Shortness of breath: Secondary | ICD-10-CM | POA: Diagnosis not present

## 2016-06-07 DIAGNOSIS — F17210 Nicotine dependence, cigarettes, uncomplicated: Secondary | ICD-10-CM | POA: Diagnosis not present

## 2016-06-07 DIAGNOSIS — J3489 Other specified disorders of nose and nasal sinuses: Secondary | ICD-10-CM | POA: Diagnosis not present

## 2016-06-07 DIAGNOSIS — I10 Essential (primary) hypertension: Secondary | ICD-10-CM | POA: Diagnosis not present

## 2016-06-07 DIAGNOSIS — R05 Cough: Secondary | ICD-10-CM | POA: Diagnosis not present

## 2016-06-07 DIAGNOSIS — R5383 Other fatigue: Secondary | ICD-10-CM | POA: Insufficient documentation

## 2016-06-07 DIAGNOSIS — M7989 Other specified soft tissue disorders: Secondary | ICD-10-CM | POA: Insufficient documentation

## 2016-06-07 LAB — COMPREHENSIVE METABOLIC PANEL
ALBUMIN: 4.2 g/dL (ref 3.5–5.0)
ALT: 14 U/L (ref 14–54)
ANION GAP: 7 (ref 5–15)
AST: 16 U/L (ref 15–41)
Alkaline Phosphatase: 82 U/L (ref 38–126)
BUN: 10 mg/dL (ref 6–20)
CO2: 29 mmol/L (ref 22–32)
Calcium: 9.1 mg/dL (ref 8.9–10.3)
Chloride: 101 mmol/L (ref 101–111)
Creatinine, Ser: 0.81 mg/dL (ref 0.44–1.00)
GFR calc Af Amer: >60 mL/min (ref >60)
GFR calc non Af Amer: >60 mL/min (ref >60)
GLUCOSE: 86 mg/dL (ref 65–99)
Potassium: 3.6 mmol/L (ref 3.5–5.1)
SODIUM: 137 mmol/L (ref 135–145)
Total Bilirubin: 0.4 mg/dL (ref 0.3–1.2)
Total Protein: 7.6 g/dL (ref 6.5–8.1)

## 2016-06-07 LAB — CBC WITH DIFFERENTIAL/PLATELET
Basophils Absolute: 0 10*3/uL (ref 0.0–0.1)
Basophils Relative: 0 %
EOS ABS: 0.2 10*3/uL (ref 0.0–0.7)
EOS PCT: 2 %
HCT: 40.7 % (ref 36.0–46.0)
Hemoglobin: 13.3 g/dL (ref 12.0–15.0)
Lymphocytes Relative: 54 %
Lymphs Abs: 4.4 10*3/uL — ABNORMAL HIGH (ref 0.7–4.0)
MCH: 28.7 pg (ref 26.0–34.0)
MCHC: 32.7 g/dL (ref 30.0–36.0)
MCV: 87.9 fL (ref 78.0–100.0)
MONOS PCT: 8 %
Monocytes Absolute: 0.7 10*3/uL (ref 0.1–1.0)
Neutro Abs: 2.9 10*3/uL (ref 1.7–7.7)
Neutrophils Relative %: 36 %
Platelets: 224 10*3/uL (ref 150–400)
RBC: 4.63 MIL/uL (ref 3.87–5.11)
RDW: 13.5 % (ref 11.5–15.5)
WBC: 8.2 10*3/uL (ref 4.0–10.5)

## 2016-06-07 LAB — TROPONIN I: Troponin I: <0.03 ng/mL (ref <0.03)

## 2016-06-07 LAB — D-DIMER, QUANTITATIVE (NOT AT ARMC)

## 2016-06-07 MED ORDER — BENZONATATE 100 MG PO CAPS: 100.0000 mg | ORAL_CAPSULE | Freq: Three times a day (TID) | ORAL | 0 refills | Status: DC | PRN

## 2016-06-07 MED ORDER — KETOROLAC TROMETHAMINE 30 MG/ML IJ SOLN
30.0000 mg | Freq: Once | INTRAMUSCULAR | Status: AC
  Administered 2016-06-07: 30 mg via INTRAVENOUS
  Filled 2016-06-07: qty 1

## 2016-06-07 MED FILL — BENZONATATE 100 MG CAPSULE: 100 | 7 days supply | Qty: 21 | Fill #0

## 2016-06-07 NOTE — ED Notes (Signed)
Patient hooked back up to cardiac monitor and vitals restarted for every 30 minutes.

## 2016-06-07 NOTE — ED Provider Notes (Signed)
[place] [facility] Provider Note   CSN: [id number] Arrival date & time: 06/07/16  1326  First Provider Contact:  First MD Initiated Contact with Patient 06/07/16 1406        History   Chief Complaint Chief Complaint  Patient presents with  . Chest Pain    HPI TAMMEY TUNNEY is a 55 y.o. female.  ADINA MCMORRAN is a 55 y.o. female with history of heart murmur and HTN presents to ED with complaint of chest pain and shortness of breath. Patient reports chest pain started this morning in her right chest. Pain is 8.5/10, non-radiating, constant/aching, described as a pressure sensation, reproducible with palpation of chest wall and movement of right arm. Deep inspiration does not make the pain worse. No known trauma, although does endorse carrying around a "suitcase" sized bag on her right shoulder along with other bags. She endorses associated shortness of breath. She has also had a productive cough with green sputum x 1 month and URI-like symptoms. She states she is generally fatigued and attributes that to working 12 hours a day. Denies hemoptysis. No changes in vision, abdominal complaints, urinary complaints, fever, diaphoresis, dizziness, lightheadedness, rashes, myaglias, arthralgias. No treatments tried.       Past Medical History:  Diagnosis Date  . Allergy    dust,pollen, grass, molds  . Heart murmur   . Hypertension     There are no active problems to display for this patient.   Past Surgical History:  Procedure Laterality Date  . ABDOMINAL HYSTERECTOMY    . ABDOMINAL SURGERY  1995   to remove a benign tumor  . CERVICAL FUSION  2006  . [place] &1984  . FOOT TUMOR EXCISION  2010   benign left  . PARTIAL HYSTERECTOMY  1994   Has one ovary    OB History    No data available       Home Medications    Prior to Admission medications   Medication Sig Start Date End Date Taking? Authorizing Provider  benzonatate (TESSALON) 100 MG  capsule Take 1 capsule (100 mg total) by mouth 3 (three) times daily as needed for cough. 06/07/16   Roxanna Mew, PA-C  hydrochlorothiazide (HYDRODIURIL) 25 MG tablet Take 1 tablet (25 mg total) by mouth daily. 11/29/15   [name], NP  ibuprofen (ADVIL,MOTRIN) 800 MG tablet Take 1 tablet (800 mg total) by mouth every 8 (eight) hours as needed for headache or moderate pain. 04/21/14   [name], MD    Family History No family history on file.  Social History Social History  Substance Use Topics  . Smoking status: Current Every Day Smoker    Packs/day: 1.00    Types: Cigarettes  . Smokeless tobacco: Never Used  . Alcohol use No     Allergies   Sulfa antibiotics; Codeine; Hydrocodone; Morphine and related; Tramadol; Tylenol [acetaminophen]; and Demerol [meperidine]   Review of Systems Review of Systems  Constitutional: Positive for fatigue.  HENT: Positive for postnasal drip, rhinorrhea and sneezing.   Respiratory: Positive for cough and shortness of breath.   Cardiovascular: Positive for chest pain ( right, chest wall) and leg swelling ( b/l, since resolved x 1 week).  All other systems reviewed and are negative.    Physical Exam Updated Vital Signs BP 116/71   Pulse 66   Temp 98.1 F (36.7 C) (Oral)   Resp 18   Ht 5\' 6"  (1.676 m)   Wt 103.9 kg  SpO2 96%   BMI 36.96 kg/m   Physical Exam  Constitutional: She appears well-developed and well-nourished. No distress.  HENT:  Head: Normocephalic and atraumatic.  Mouth/Throat: Oropharynx is clear and moist. No oropharyngeal exudate.  Eyes: Conjunctivae and EOM are normal. Pupils are equal, round, and reactive to light. Right eye exhibits no discharge. Left eye exhibits no discharge. No scleral icterus.  Neck: Normal range of motion. Neck supple.  Cardiovascular: Normal rate, regular rhythm and intact distal pulses.   Murmur ( systolic ejection murmur) heard. Pulmonary/Chest: Effort normal and breath sounds  normal. No respiratory distress. She exhibits tenderness.    TTP of right chest wall at site of chest pain.   Abdominal: Soft. Bowel sounds are normal. There is no tenderness. There is no rebound and no guarding.  Musculoskeletal: Normal range of motion.  Lymphadenopathy:    She has no cervical adenopathy.  Neurological: She is alert. Coordination normal.  Skin: Skin is warm and dry. She is not diaphoretic.  Psychiatric: She has a normal mood and affect. Her behavior is normal.     ED Treatments / Results  Labs (all labs ordered are listed, but only abnormal results are displayed) Labs Reviewed  CBC WITH DIFFERENTIAL/PLATELET - Abnormal; Notable for the following:       Result Value   Lymphs Abs 4.4 (*)    All other components within normal limits  COMPREHENSIVE METABOLIC PANEL  TROPONIN I  D-DIMER, QUANTITATIVE (NOT AT [facility])  TROPONIN I    EKG  EKG Interpretation  Date/Time:  Wednesday June 07 2016 13:38:11 EDT Ventricular Rate:  74 PR Interval:    QRS Duration: 107 QT Interval:  421 QTC Calculation: 468 R Axis:   -4 Text Interpretation:  Sinus rhythm Prolonged PR interval Left ventricular hypertrophy No significant change since last tracing Confirmed by Gerald Leitz ([id number]) on 06/07/2016 2:24:40 PM       Radiology Dg Chest 2 View  Result Date: 06/07/2016 CLINICAL DATA:  Chest pain and shortness of breath since this morning, smoker, hypertension EXAM: CHEST  2 VIEW COMPARISON:  11/30/2015 FINDINGS: Normal heart size, mediastinal contours, and pulmonary vascularity. Lungs clear. No pleural effusion or pneumothorax. Bones demineralized. Prior cervical spine fusion. IMPRESSION: No acute abnormalities. Electronically Signed   By: Lavonia Dana M.D.   On: 06/07/2016 13:57    Procedures Procedures (including critical care time)  Medications Ordered in ED Medications  ketorolac (TORADOL) 30 MG/ML injection 30 mg (30 mg Intravenous Given 06/07/16 1640)     Initial  Impression / Assessment and Plan / ED Course  I have reviewed the triage vital signs and the nursing notes.  Pertinent labs & imaging results that were available during my care of the patient were reviewed by me and considered in my medical decision making (see chart for details).  Clinical Course  Comment By Time  Patient endorses improvement in pain following toradol.  Roxanna Mew, [place] 08/02 1700    Patient is afebrile and non-toxic appearing in NAD. Vital signs are stable. Physical exam remarkable for TTP of right chest wall. EKG shows no significant changes compared to previous. CXR negative for PNA, pleural effusion, or pneumothorax. D-dimer negative - low suspicion for pulmonary embolus. CMP and CBC re-assuring. Delta troponin negative. Low suspicion for cardiac etiology.   Patient endorses improvement in pain following toradol. Given history and physical exam findings with reproducibility of pain, suspect MSK etiology secondary to ?heavy bag carrying vs. ?Cough x 1 month. Discussed symptomatic  management, including use of motrin for pain control. Rx tessalon perles for cough relief. Encouraged follow up with PCP in the next week for re-evaluation. Return precautions discussed. Patient voiced understanding and is agreeable.   Final Clinical Impressions(s) / ED Diagnoses   Final diagnoses:  Chest wall pain    New Prescriptions Discharge Medication List as of 06/07/2016  5:26 PM    START taking these medications   Details  benzonatate (TESSALON) 100 MG capsule Take 1 capsule (100 mg total) by mouth 3 (three) times daily as needed for cough., Starting Wed 06/07/2016, Print         [place], [place] 06/07/16 2115    [place], MD 06/08/16 [phone]

## 2016-06-07 NOTE — Discharge Instructions (Signed)
Read the information below.   Your labs and imaging were re-assuring. You were given toradol in the ED with improvement in pain.  You can take ibuprofen 400mg  every 6hrs as needed for pain relief. I am prescribing tessalon perles for cough relief. Take as needed.  Use the prescribed medication as directed.  Please discuss all new medications with your pharmacist.   I encourage you to follow up with your primary care provider in the next week for re-evaluation.  You may return to the Emergency Department at any time for worsening condition or any new symptoms that concern you. Return to the ED if your symptoms worsen or you develop a fever, blood in sputum, crushing/pressure chest pain, shortness of breath, loss of consciousness, one sided lower leg swelling or pain.

## 2016-06-07 NOTE — ED Triage Notes (Signed)
C/o CP, SOB x today-states pain is right side and worse when she moves right arm-also states she has had chest cold since June-NAD-steady gait

## 2017-02-01 ENCOUNTER — Encounter: Payer: Self-pay | Admitting: Gastroenterology

## 2019-01-23 DIAGNOSIS — R0789 Other chest pain: Secondary | ICD-10-CM | POA: Diagnosis not present

## 2019-01-23 DIAGNOSIS — N644 Mastodynia: Secondary | ICD-10-CM | POA: Diagnosis not present

## 2019-03-28 DIAGNOSIS — Z1231 Encounter for screening mammogram for malignant neoplasm of breast: Secondary | ICD-10-CM | POA: Diagnosis not present

## 2019-09-15 DIAGNOSIS — I10 Essential (primary) hypertension: Secondary | ICD-10-CM | POA: Diagnosis not present

## 2019-09-15 DIAGNOSIS — Z0000 Encounter for general adult medical examination without abnormal findings: Secondary | ICD-10-CM | POA: Diagnosis not present

## 2019-09-15 DIAGNOSIS — E7800 Pure hypercholesterolemia, unspecified: Secondary | ICD-10-CM | POA: Diagnosis not present

## 2019-09-15 DIAGNOSIS — F17200 Nicotine dependence, unspecified, uncomplicated: Secondary | ICD-10-CM | POA: Diagnosis not present

## 2019-09-16 ENCOUNTER — Encounter: Payer: Self-pay | Admitting: Gastroenterology

## 2019-09-26 DIAGNOSIS — Z23 Encounter for immunization: Secondary | ICD-10-CM | POA: Diagnosis not present

## 2019-09-26 DIAGNOSIS — E7800 Pure hypercholesterolemia, unspecified: Secondary | ICD-10-CM | POA: Diagnosis not present

## 2019-09-26 DIAGNOSIS — Z1159 Encounter for screening for other viral diseases: Secondary | ICD-10-CM | POA: Diagnosis not present

## 2019-09-26 DIAGNOSIS — I10 Essential (primary) hypertension: Secondary | ICD-10-CM | POA: Diagnosis not present

## 2019-10-09 ENCOUNTER — Ambulatory Visit (AMBULATORY_SURGERY_CENTER): Payer: Self-pay

## 2019-10-09 ENCOUNTER — Encounter: Payer: Self-pay | Admitting: Gastroenterology

## 2019-10-09 ENCOUNTER — Other Ambulatory Visit: Payer: Self-pay

## 2019-10-09 VITALS — Temp 96.8°F | Ht 66.0 in | Wt 217.0 lb

## 2019-10-09 DIAGNOSIS — Z86010 Personal history of colonic polyps: Secondary | ICD-10-CM

## 2019-10-09 MED ORDER — NA SULFATE-K SULFATE-MG SULF 17.5-3.13-1.6 GM/177ML PO SOLN: 1.0000 | Freq: Once | ORAL | 0 refills | Status: AC

## 2019-10-09 NOTE — Progress Notes (Signed)
Denies allergies to eggs or soy products. Denies complication of anesthesia or sedation. Denies use of weight loss medication. Denies use of O2.   Emmi instructions given for colonoscopy.  Covid screening is scheduled for Tuesday 10/21/19 @ 3:10 Pm. A 15.00 coupon for Suprep was given to the patient.

## 2019-10-21 ENCOUNTER — Ambulatory Visit (INDEPENDENT_AMBULATORY_CARE_PROVIDER_SITE_OTHER): Payer: BC Managed Care – PPO

## 2019-10-21 ENCOUNTER — Other Ambulatory Visit: Payer: Self-pay | Admitting: Gastroenterology

## 2019-10-21 DIAGNOSIS — Z1159 Encounter for screening for other viral diseases: Secondary | ICD-10-CM

## 2019-10-22 LAB — SARS CORONAVIRUS 2 (TAT 6-24 HRS): SARS Coronavirus 2: NEGATIVE

## 2019-10-24 ENCOUNTER — Other Ambulatory Visit: Payer: Self-pay

## 2019-10-24 ENCOUNTER — Encounter: Payer: Self-pay | Admitting: Gastroenterology

## 2019-10-24 ENCOUNTER — Ambulatory Visit (AMBULATORY_SURGERY_CENTER): Payer: BC Managed Care – PPO | Admitting: Gastroenterology

## 2019-10-24 VITALS — BP 165/80 | HR 74 | Temp 98.3°F | Resp 22 | Ht 66.0 in | Wt 217.0 lb

## 2019-10-24 DIAGNOSIS — Z1211 Encounter for screening for malignant neoplasm of colon: Secondary | ICD-10-CM | POA: Diagnosis not present

## 2019-10-24 DIAGNOSIS — Z86010 Personal history of colonic polyps: Secondary | ICD-10-CM | POA: Diagnosis not present

## 2019-10-24 DIAGNOSIS — D123 Benign neoplasm of transverse colon: Secondary | ICD-10-CM | POA: Diagnosis not present

## 2019-10-24 MED ORDER — SODIUM CHLORIDE 0.9 % IV SOLN: 500.0000 mL | Freq: Once | INTRAVENOUS | Status: DC

## 2019-10-24 NOTE — Patient Instructions (Signed)
One tiny polyp removed. Difficulty passing the scope due to your having a tortuous colon so abdominal pressure had to be applied, so you may have some abdominal soreness according to Dr Havery Moros.    YOU HAD AN ENDOSCOPIC PROCEDURE TODAY AT [facility]:   Refer to the procedure report that was given to you for any specific questions about what was found during the examination.  If the procedure report does not answer your questions, please call your gastroenterologist to clarify.  If you requested that your care partner not be given the details of your procedure findings, then the procedure report has been included in a sealed envelope for you to review at your convenience later.  YOU SHOULD EXPECT: Some feelings of bloating in the abdomen. Passage of more gas than usual.  Walking can help get rid of the air that was put into your GI tract during the procedure and reduce the bloating. If you had a lower endoscopy (such as a colonoscopy or flexible sigmoidoscopy) you may notice spotting of blood in your stool or on the toilet paper. If you underwent a bowel prep for your procedure, you may not have a normal bowel movement for a few days.  Please Note:  You might notice some irritation and congestion in your nose or some drainage.  This is from the oxygen used during your procedure.  There is no need for concern and it should clear up in a day or so.  SYMPTOMS TO REPORT IMMEDIATELY:   Following lower endoscopy (colonoscopy or flexible sigmoidoscopy):  Excessive amounts of blood in the stool  Significant tenderness or worsening of abdominal pains  Swelling of the abdomen that is new, acute  Fever of 100F or higher   For urgent or emergent issues, a gastroenterologist can be reached at any hour by calling [phone].   DIET:  We do recommend a small meal at first, but then you may proceed to your regular diet.  Drink plenty of fluids but you should avoid alcoholic beverages  for 24 hours.  ACTIVITY:  You should plan to take it easy for the rest of today and you should NOT DRIVE or use heavy machinery until tomorrow (because of the sedation medicines used during the test).    FOLLOW UP: Our staff will call the number listed on your records 48-72 hours following your procedure to check on you and address any questions or concerns that you may have regarding the information given to you following your procedure. If we do not reach you, we will leave a message.  We will attempt to reach you two times.  During this call, we will ask if you have developed any symptoms of COVID 19. If you develop any symptoms (ie: fever, flu-like symptoms, shortness of breath, cough etc.) before then, please call [phone].  If you test positive for Covid 19 in the 2 weeks post procedure, please call and report this information to [place].    If any biopsies were taken you will be contacted by phone or by letter within the next 1-3 weeks.  Please call us at [phone] if you have not heard about the biopsies in 3 weeks.    SIGNATURES/CONFIDENTIALITY: You and/or your care partner have signed paperwork which will be entered into your electronic medical record.  These signatures attest to the fact that that the information above on your After Visit Summary has been reviewed and is understood.  Full responsibility of the confidentiality of  this discharge information lies with you and/or your care-partner.

## 2019-10-24 NOTE — Op Note (Signed)
[facility] Patient Name: Veronica Carter Procedure Date: 10/24/2019 2:06 PM MRN: [id number] Endoscopist: Remo Lipps P. Havery Moros , MD Age: 58 Referring MD:  Date of Birth: [birth year] Gender: Female Account #: [id number] Procedure:                Colonoscopy Indications:              High risk colon cancer surveillance: Personal                            history of colonic polyps (adenoma 2013) Medicines:                Monitored Anesthesia Care Procedure:                Pre-Anesthesia Assessment:                           - Prior to the procedure, a History and Physical                            was performed, and patient medications and                            allergies were reviewed. The patient's tolerance of                            previous anesthesia was also reviewed. The risks                            and benefits of the procedure and the sedation                            options and risks were discussed with the patient.                            All questions were answered, and informed consent                            was obtained. Prior Anticoagulants: The patient has                            taken no previous anticoagulant or antiplatelet                            agents. ASA Grade Assessment: II - A patient with                            mild systemic disease. After reviewing the risks                            and benefits, the patient was deemed in                            satisfactory condition to undergo the procedure.  After obtaining informed consent, the colonoscope                            was passed under direct vision. Throughout the                            procedure, the patient's blood pressure, pulse, and                            oxygen saturations were monitored continuously. The                            Colonoscope was introduced through the anus and                            advanced to the  the terminal ileum, with                            identification of the appendiceal orifice and IC                            valve. The colonoscopy was technically difficult                            and complex due to a tortuous colon. The patient                            tolerated the procedure well. The quality of the                            bowel preparation was good. The terminal ileum,                            ileocecal valve, appendiceal orifice, and rectum                            were photographed. Scope In: 2:11:33 PM Scope Out: 2:38:35 PM Scope Withdrawal Time: 0 hours 20 minutes 6 seconds  Total Procedure Duration: 0 hours 27 minutes 2 seconds  Findings:                 The perianal and digital rectal examinations were                            normal.                           The terminal ileum appeared normal.                           The ileocecal valve was severely lipomatous.                           The colon was significantly tortuous, cecal  intubation was challenging.                           A 4 mm polyp was found in the transverse colon. The                            polyp was sessile. The polyp was removed with a                            cold snare. Resection and retrieval were complete.                           The exam was otherwise without abnormality. Complications:            No immediate complications. Estimated blood loss:                            Minimal. Estimated Blood Loss:     Estimated blood loss was minimal. Impression:               - The examined portion of the ileum was normal.                           - Lipomatous ileocecal valve.                           - Tortuous colon.                           - One 4 mm polyp in the transverse colon, removed                            with a cold snare. Resected and retrieved.                           - The examination was otherwise  normal. Recommendation:           - Patient has a contact number available for                            emergencies. The signs and symptoms of potential                            delayed complications were discussed with the                            patient. Return to normal activities tomorrow.                            Written discharge instructions were provided to the                            patient.                           - Resume previous diet.                           -  Continue present medications.                           - Await pathology results. Remo Lipps P. Thermon Zulauf, MD 10/24/2019 2:42:17 PM This report has been signed electronically.

## 2019-10-24 NOTE — Progress Notes (Signed)
To PACU, VSS. Report to [email or web address] 

## 2019-10-24 NOTE — Progress Notes (Signed)
Called to room to assist during endoscopic procedure.  Patient ID and intended procedure confirmed with present staff. Received instructions for my participation in the procedure from the performing physician.  

## 2019-10-24 NOTE — Progress Notes (Signed)
Pt's states no medical or surgical changes since previsit or office visit.  Temp JB VS DT  

## 2019-10-28 ENCOUNTER — Telehealth: Payer: Self-pay | Admitting: *Deleted

## 2019-10-28 NOTE — Telephone Encounter (Signed)
Follow up call made, Left message.

## 2019-10-28 NOTE — Telephone Encounter (Signed)
No answer for post procedure call back. Left message for patient to call with questions or concerns. 

## 2019-11-04 ENCOUNTER — Encounter: Payer: Self-pay | Admitting: Gastroenterology

## 2020-03-10 DIAGNOSIS — M545 Low back pain: Secondary | ICD-10-CM | POA: Diagnosis not present

## 2020-03-10 DIAGNOSIS — F17210 Nicotine dependence, cigarettes, uncomplicated: Secondary | ICD-10-CM | POA: Diagnosis not present

## 2020-03-10 DIAGNOSIS — N644 Mastodynia: Secondary | ICD-10-CM | POA: Diagnosis not present

## 2020-04-06 DIAGNOSIS — N644 Mastodynia: Secondary | ICD-10-CM | POA: Diagnosis not present

## 2020-04-06 DIAGNOSIS — R928 Other abnormal and inconclusive findings on diagnostic imaging of breast: Secondary | ICD-10-CM | POA: Diagnosis not present

## 2020-04-06 HISTORY — PX: BREAST BIOPSY: SHX20

## 2020-04-14 ENCOUNTER — Other Ambulatory Visit: Payer: Self-pay | Admitting: Radiology

## 2020-04-14 DIAGNOSIS — R590 Localized enlarged lymph nodes: Secondary | ICD-10-CM | POA: Diagnosis not present

## 2020-07-08 DIAGNOSIS — I10 Essential (primary) hypertension: Secondary | ICD-10-CM | POA: Diagnosis not present

## 2020-07-15 ENCOUNTER — Telehealth (HOSPITAL_COMMUNITY): Payer: Self-pay | Admitting: Nurse Practitioner

## 2020-07-15 NOTE — Telephone Encounter (Signed)
Called to discuss with [facility] about Covid symptoms and the use of casirivimab/imdevimab, a combination monoclonal antibody infusion for those with mild to moderate Covid symptoms and at a high risk of hospitalization.     Pt is qualified for this infusion at the [facility] infusion center due to co-morbid conditions (hypertension/smoker). Unable to reach patient. Voicemail left.   Alda Lea, AGPCNP-BC

## 2020-07-21 DIAGNOSIS — Z20822 Contact with and (suspected) exposure to covid-19: Secondary | ICD-10-CM | POA: Diagnosis not present

## 2021-01-04 ENCOUNTER — Other Ambulatory Visit: Payer: Self-pay

## 2021-01-04 ENCOUNTER — Encounter (HOSPITAL_BASED_OUTPATIENT_CLINIC_OR_DEPARTMENT_OTHER): Payer: Self-pay | Admitting: Emergency Medicine

## 2021-01-04 ENCOUNTER — Emergency Department (HOSPITAL_BASED_OUTPATIENT_CLINIC_OR_DEPARTMENT_OTHER)
Admission: EM | Admit: 2021-01-04 | Discharge: 2021-01-04 | Disposition: A | Discharge status: Home / Self Care | Payer: BC Managed Care – PPO | Attending: Emergency Medicine | Admitting: Emergency Medicine

## 2021-01-04 ENCOUNTER — Emergency Department (HOSPITAL_BASED_OUTPATIENT_CLINIC_OR_DEPARTMENT_OTHER): Payer: BC Managed Care – PPO

## 2021-01-04 DIAGNOSIS — M4722 Other spondylosis with radiculopathy, cervical region: Secondary | ICD-10-CM | POA: Diagnosis not present

## 2021-01-04 DIAGNOSIS — M5412 Radiculopathy, cervical region: Secondary | ICD-10-CM | POA: Diagnosis not present

## 2021-01-04 DIAGNOSIS — M50122 Cervical disc disorder at C5-C6 level with radiculopathy: Secondary | ICD-10-CM | POA: Diagnosis not present

## 2021-01-04 DIAGNOSIS — M25512 Pain in left shoulder: Secondary | ICD-10-CM | POA: Diagnosis not present

## 2021-01-04 DIAGNOSIS — Z79899 Other long term (current) drug therapy: Secondary | ICD-10-CM | POA: Insufficient documentation

## 2021-01-04 DIAGNOSIS — F17210 Nicotine dependence, cigarettes, uncomplicated: Secondary | ICD-10-CM | POA: Diagnosis not present

## 2021-01-04 DIAGNOSIS — I10 Essential (primary) hypertension: Secondary | ICD-10-CM | POA: Diagnosis not present

## 2021-01-04 DIAGNOSIS — M4322 Fusion of spine, cervical region: Secondary | ICD-10-CM | POA: Diagnosis not present

## 2021-01-04 DIAGNOSIS — Z981 Arthrodesis status: Secondary | ICD-10-CM | POA: Diagnosis not present

## 2021-01-04 MED ORDER — LIDOCAINE 5 % EX PTCH
1.0000 | MEDICATED_PATCH | CUTANEOUS | Status: DC
  Administered 2021-01-04: 1 via TRANSDERMAL
  Filled 2021-01-04: qty 1

## 2021-01-04 MED ORDER — PREDNISONE 50 MG PO TABS: 50.0000 mg | ORAL_TABLET | Freq: Every day | ORAL | 0 refills | Status: DC

## 2021-01-04 MED ORDER — METHOCARBAMOL 500 MG PO TABS: 500.0000 mg | ORAL_TABLET | Freq: Two times a day (BID) | ORAL | 0 refills | Status: DC

## 2021-01-04 MED ORDER — KETOROLAC TROMETHAMINE 30 MG/ML IJ SOLN
30.0000 mg | Freq: Once | INTRAMUSCULAR | Status: AC
  Administered 2021-01-04: 30 mg via INTRAMUSCULAR
  Filled 2021-01-04: qty 1

## 2021-01-04 NOTE — ED Provider Notes (Signed)
[facility] EMERGENCY DEPARTMENT Provider Note   CSN: [id number] Arrival date & time: 01/04/21  1516     History Chief Complaint  Patient presents with  . Arm Pain    Veronica Carter is a 60 y.o. female who presents with concern for about 1 week of progressively worsening left shoulder pain that intermittently shoots down her left arm.  Additionally she experiences intermittent numbness and tingling sensation in her entire left hand.  She has history of cervical spine surgery, she states she feels like this is "nerve pain".  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  She states he has been under a lot of stress recently feels that her shoulders have been quite tight.  She denies any pain in the neck, decreased range of motion, denies fevers or chills at home.  She is not an IV drug user.  She denies any weakness in her hand, and states that the pain is worse with movement.  I personally read this patient's medical records she history of hypertension on HCTZ, history of arthritis and cervicals spinal fusion.  HPI     Past Medical History:  Diagnosis Date  . Allergy    dust,pollen, grass, molds  . Arthritis   . Heart murmur   . Hypertension     There are no problems to display for this patient.   Past Surgical History:  Procedure Laterality Date  . ABDOMINAL HYSTERECTOMY    . ABDOMINAL SURGERY  1995   to remove a benign tumor  . CERVICAL FUSION  2006  . [place] &1984  . FOOT TUMOR EXCISION  2010   benign left  . PARTIAL HYSTERECTOMY  1994   Has one ovary     OB History   No obstetric history on file.     Family History  Problem Relation Age of Onset  . Colon cancer Neg Hx   . Esophageal cancer Neg Hx   . Rectal cancer Neg Hx   . Stomach cancer Neg Hx     Social History   Tobacco Use  . Smoking status: Current Every Day Smoker    Packs/day: 1.00    Types: Cigarettes  .  Smokeless tobacco: Never Used  Vaping Use  . Vaping Use: Never used  Substance Use Topics  . Alcohol use: Yes    Comment: occasional wine  . Drug use: No    Home Medications Prior to Admission medications   Medication Sig Start Date End Date Taking? Authorizing Provider  hydrochlorothiazide (HYDRODIURIL) 25 MG tablet Take 1 tablet (25 mg total) by mouth daily. 11/29/15  Yes Mabe, Shanon Brow, NP  ibuprofen (ADVIL,MOTRIN) 800 MG tablet Take 1 tablet (800 mg total) by mouth every 8 (eight) hours as needed for headache or moderate pain. 04/21/14  Yes [name], MD  methocarbamol (ROBAXIN) 500 MG tablet Take 1 tablet (500 mg total) by mouth 2 (two) times daily. 01/04/21  Yes Sharilynn Cassity, Gypsy Balsam, PA-C  predniSONE (DELTASONE) 50 MG tablet Take 1 tablet (50 mg total) by mouth daily. 01/04/21  Yes Stephanee Barcomb R, PA-C    Allergies    Sulfa antibiotics, Codeine, Hydrocodone, Morphine and related, Tramadol, Tylenol [acetaminophen], and Demerol [meperidine]  Review of Systems   Review of Systems  Constitutional: Negative.   HENT: Negative.   Eyes: Negative.   Respiratory: Negative.   Cardiovascular: Negative.   Gastrointestinal: Negative.   Genitourinary: Negative.  Musculoskeletal: Positive for myalgias.  Skin: Negative.   Neurological: Positive for numbness. Negative for dizziness, tremors, weakness, light-headedness and headaches.  Psychiatric/Behavioral: Negative.     Physical Exam Updated Vital Signs BP (!) 149/79 (BP Location: Left Arm)   Pulse 65   Temp 98.6 F (37 C) (Oral)   Resp 18   Ht 5\' 6"  (1.676 m)   Wt 92.5 kg   SpO2 99%   BMI 32.93 kg/m   Physical Exam Vitals and nursing note reviewed.  HENT:     Head: Normocephalic and atraumatic.     Mouth/Throat:     Mouth: Mucous membranes are moist.     Pharynx: No oropharyngeal exudate or posterior oropharyngeal erythema.  Eyes:     General: Lids are normal. Vision grossly intact.        Right eye: No discharge.         Left eye: No discharge.     Conjunctiva/sclera: Conjunctivae normal.  Neck:     Trachea: Trachea and phonation normal.  Cardiovascular:     Rate and Rhythm: Normal rate and regular rhythm.     Pulses: Normal pulses.          Radial pulses are 2+ on the right side and 2+ on the left side.     Heart sounds: Normal heart sounds.  Pulmonary:     Effort: Pulmonary effort is normal. No respiratory distress.     Breath sounds: Normal breath sounds. No wheezing or rales.  Chest:     Chest wall: No deformity, swelling, tenderness, crepitus or edema.  Abdominal:     General: There is no distension.     Palpations: Abdomen is soft.     Tenderness: There is no abdominal tenderness.  Musculoskeletal:        General: No deformity. Normal range of motion.     Right shoulder: Normal.     Left shoulder: Tenderness present. No swelling, deformity, bony tenderness or crepitus. Normal strength. Normal pulse.     Right upper arm: Normal.     Left upper arm: Normal.     Right elbow: Normal.     Left elbow: Normal.     Right forearm: Normal.     Right wrist: Normal.     Left wrist: Normal.     Right hand: Normal.     Left hand: Normal.       Arms:     Cervical back: Full passive range of motion without pain, normal range of motion and neck supple. No crepitus. No pain with movement, spinous process tenderness or muscular tenderness.     Right lower leg: No edema.     Left lower leg: No edema.     Comments: Spasm of the cervical paraspinous musculature bilaterally,  Spasm of the trapezius bilaterally, L > R  5/5 grip strength bilaterally  5/5 strength in elbow flexion and extension bilaterally  Lymphadenopathy:     Cervical: No cervical adenopathy.  Skin:    General: Skin is warm and dry.     Capillary Refill: Capillary refill takes less than 2 seconds.  Neurological:     Mental Status: She is alert and oriented to person, place, and time. Mental status is at baseline.     Sensory:  Sensation is intact.     Motor: Motor function is intact.  Psychiatric:        Mood and Affect: Mood normal.     ED Results / Procedures / Treatments  Labs (all labs ordered are listed, but only abnormal results are displayed) Labs Reviewed - No data to display  EKG EKG Interpretation  Date/Time:  Tuesday January 04 2021 15:17:53 EST Ventricular Rate:  66 PR Interval:  188 QRS Duration: 116 QT Interval:  394 QTC Calculation: 413 R Axis:   -4 Text Interpretation: Normal sinus rhythm Left ventricular hypertrophy with QRS widening ( R in aVL , Cornell product ) Cannot rule out Septal infarct , age undetermined Abnormal ECG No significant change since last tracing Confirmed by Nanda Quinton [phone]) on 01/04/2021 3:41:34 PM   Radiology DG Cervical Spine Complete  Result Date: 01/04/2021 CLINICAL DATA:  Radicular pain EXAM: CERVICAL SPINE - COMPLETE 4+ VIEW COMPARISON:  05/23/2005 FINDINGS: Anterior fusion changes C3 through C5. Generalized straightening of the cervical spine. Vertebral body heights are maintained. Dens and lateral masses are within normal limits. Moderate degenerative changes C5-C6 and C6-C7 with mild bilateral foraminal narrowing at these levels. IMPRESSION: Anterior fusion changes C3 through C5 with moderate degenerative changes at C5-C6 and C6-C7. Electronically Signed   By: Donavan Foil M.D.   On: 01/04/2021 16:43    Procedures Procedures   Medications Ordered in ED Medications  lidocaine (LIDODERM) 5 % 1 patch (1 patch Transdermal Patch Applied 01/04/21 1612)  ketorolac (TORADOL) 30 MG/ML injection 30 mg (30 mg Intramuscular Given 01/04/21 1612)    ED Course  I have reviewed the triage vital signs and the nursing notes.  Pertinent labs & imaging results that were available during my care of the patient were reviewed by me and considered in my medical decision making (see chart for details).    MDM Rules/Calculators/A&P                         60 year old  female presents with concern for 1 week of left shoulder pain radiates down to her left arm.  Hypertensive on intake, vital signs otherwise normal.  Cardiopulmonary exam is normal, abdominal exam is benign.  Her spasm of the trapezius bilaterally, left greater than right, trigger point in her left trap, radiating symptoms down to her left arm.  Cervical paraspinous musculature spasm without tenderness to palpation.  Patient is symmetric strength and sensation in hands and arms bilaterally.  She is neurovascular intact in all 4 extremities.  Differential diagnosis patient symptoms include but are not limited to muscle spasm, cervical radiculopathy, muscular strain, rotator cuff tear, impingement syndrome, brachial plexus injury, MI.  Patient not having any chest symptoms, do not feel this is ACS component.  Based on physical exam suspect patient symptoms are secondary to cervical radiculopathy with associated muscle spasm.  Plain film of the cervical spine revealed known degenerative changes and stable hardware from prior cervical fusion.  Administer Lidoderm patch and Toradol in the ED and sent home with prescription for steroid burst and muscle relaxer.  Recommend she follow-up with her primary care doctor.  Given reassuring physical exam and vital signs do not feel any further work-up is warranted in the ED at this time.  Lesly voiced understanding of her medical evaluation and treatment plan.  Each of her questions was answered to her expressed satisfaction.  Return precautions were given.  Patient is well-appearing, stable, and appropriate for discharge at this time.  This chart was dictated using voice recognition software, Dragon. Despite the best efforts of this provider to proofread and correct errors, errors may still occur which can change documentation meaning.  Final Clinical Impression(s) /  ED Diagnoses Final diagnoses:  Cervical radiculopathy    Rx / DC Orders ED Discharge Orders          Ordered    predniSONE (DELTASONE) 50 MG tablet  Daily        01/04/21 1638    methocarbamol (ROBAXIN) 500 MG tablet  2 times daily        01/04/21 1638           Tayli Buch, Gypsy Balsam, PA-C 01/04/21 1742    Margette Fast, MD 01/13/21 1002

## 2021-01-04 NOTE — ED Triage Notes (Signed)
Reports pain in left arm that radiates down to hand.  Hand feels numb currently.  Going on for several days.  Hx of cervical spine surgery years ago and this feels like nerve pain.

## 2021-01-04 NOTE — Discharge Instructions (Addendum)
You were seen in the emergency department today for your left arm pain.  Your physical exam and vital signs are very reassuring.  The muscles in your neck and shoulder are in what is called spasm, meaning they are inappropriately tightened up.  This can be quite painful.  To help with your pain you may take Tylenol and / or NSAID medication (such as ibuprofen or naproxen) to help with your pain.  Additionally, you have been prescribed a muscle relaxer called Robaxin to help relieve some of the muscle spasm.  Please be advised that this medication may make you very sleepy, so you should not drive or operate heavy machinery while you are taking it.  In regards to the shooting pain in numbness and tingling you are experiencing in your left hand, I suspect is secondary to what is called a cervical radiculopathy, which is inflammation of the nerves coming from your neck down into your arm.  To treat this, you been prescribed 5 days of steroid which you take daily at home.  You may also utilize topical pain relief such as Biofreeze, IcyHot, or topical lidocaine patches.  I also recommend that you apply heat to the area, such as a hot shower or heating pad, and follow heat application with massage of the muscles that are most tight.  Please return to the emergency department if you develop any numbness/tingling/weakness in your arms or legs, any difficulty urinating, or urinary incontinence chest pain, shortness of breath, abdominal pain, nausea or vomiting that does not stop, or any other new severe symptoms.

## 2021-01-27 DIAGNOSIS — Z01419 Encounter for gynecological examination (general) (routine) without abnormal findings: Secondary | ICD-10-CM | POA: Diagnosis not present

## 2021-01-27 DIAGNOSIS — N644 Mastodynia: Secondary | ICD-10-CM | POA: Diagnosis not present

## 2021-01-27 DIAGNOSIS — I10 Essential (primary) hypertension: Secondary | ICD-10-CM | POA: Diagnosis not present

## 2021-01-27 DIAGNOSIS — Z90710 Acquired absence of both cervix and uterus: Secondary | ICD-10-CM | POA: Diagnosis not present

## 2021-02-02 DIAGNOSIS — M5412 Radiculopathy, cervical region: Secondary | ICD-10-CM | POA: Diagnosis not present

## 2021-02-02 DIAGNOSIS — Z981 Arthrodesis status: Secondary | ICD-10-CM | POA: Diagnosis not present

## 2021-02-02 DIAGNOSIS — M542 Cervicalgia: Secondary | ICD-10-CM | POA: Diagnosis not present

## 2021-02-21 DIAGNOSIS — M5412 Radiculopathy, cervical region: Secondary | ICD-10-CM | POA: Diagnosis not present

## 2021-03-16 DIAGNOSIS — M5412 Radiculopathy, cervical region: Secondary | ICD-10-CM | POA: Diagnosis not present

## 2021-03-30 DIAGNOSIS — M5412 Radiculopathy, cervical region: Secondary | ICD-10-CM | POA: Diagnosis not present

## 2021-05-11 DIAGNOSIS — N644 Mastodynia: Secondary | ICD-10-CM | POA: Diagnosis not present

## 2021-05-11 DIAGNOSIS — I10 Essential (primary) hypertension: Secondary | ICD-10-CM | POA: Diagnosis not present

## 2021-05-11 DIAGNOSIS — F17210 Nicotine dependence, cigarettes, uncomplicated: Secondary | ICD-10-CM | POA: Diagnosis not present

## 2021-05-17 ENCOUNTER — Other Ambulatory Visit: Payer: Self-pay | Admitting: Family Medicine

## 2021-05-17 DIAGNOSIS — N644 Mastodynia: Secondary | ICD-10-CM

## 2021-06-17 ENCOUNTER — Ambulatory Visit: Payer: BC Managed Care – PPO

## 2021-06-17 ENCOUNTER — Ambulatory Visit: Admission: RE | Admit: 2021-06-17 | Payer: BC Managed Care – PPO | Source: Ambulatory Visit

## 2021-06-17 ENCOUNTER — Other Ambulatory Visit: Payer: Self-pay

## 2021-06-17 ENCOUNTER — Ambulatory Visit
Admission: RE | Admit: 2021-06-17 | Discharge: 2021-06-17 | Disposition: A | Discharge status: Home / Self Care | Payer: BC Managed Care – PPO | Source: Ambulatory Visit | Attending: Family Medicine | Admitting: Family Medicine

## 2021-06-17 DIAGNOSIS — N644 Mastodynia: Secondary | ICD-10-CM

## 2021-06-17 DIAGNOSIS — R922 Inconclusive mammogram: Secondary | ICD-10-CM | POA: Diagnosis not present

## 2021-06-22 DIAGNOSIS — M5410 Radiculopathy, site unspecified: Secondary | ICD-10-CM | POA: Diagnosis not present

## 2021-06-22 DIAGNOSIS — M62838 Other muscle spasm: Secondary | ICD-10-CM | POA: Diagnosis not present

## 2021-06-22 DIAGNOSIS — R202 Paresthesia of skin: Secondary | ICD-10-CM | POA: Diagnosis not present

## 2021-06-28 ENCOUNTER — Other Ambulatory Visit: Payer: Self-pay

## 2021-06-28 ENCOUNTER — Ambulatory Visit (HOSPITAL_BASED_OUTPATIENT_CLINIC_OR_DEPARTMENT_OTHER): Payer: BC Managed Care – PPO | Admitting: Cardiovascular Disease

## 2021-06-28 ENCOUNTER — Encounter (HOSPITAL_BASED_OUTPATIENT_CLINIC_OR_DEPARTMENT_OTHER): Payer: Self-pay | Admitting: Cardiovascular Disease

## 2021-06-28 ENCOUNTER — Encounter: Payer: Self-pay | Admitting: *Deleted

## 2021-06-28 VITALS — BP 168/88 | HR 76 | Ht 66.5 in | Wt 208.4 lb

## 2021-06-28 DIAGNOSIS — Z720 Tobacco use: Secondary | ICD-10-CM | POA: Diagnosis not present

## 2021-06-28 DIAGNOSIS — Z5181 Encounter for therapeutic drug level monitoring: Secondary | ICD-10-CM

## 2021-06-28 DIAGNOSIS — E781 Pure hyperglyceridemia: Secondary | ICD-10-CM

## 2021-06-28 DIAGNOSIS — E782 Mixed hyperlipidemia: Secondary | ICD-10-CM

## 2021-06-28 DIAGNOSIS — E785 Hyperlipidemia, unspecified: Secondary | ICD-10-CM

## 2021-06-28 DIAGNOSIS — R011 Cardiac murmur, unspecified: Secondary | ICD-10-CM

## 2021-06-28 DIAGNOSIS — E669 Obesity, unspecified: Secondary | ICD-10-CM | POA: Diagnosis not present

## 2021-06-28 DIAGNOSIS — I10 Essential (primary) hypertension: Secondary | ICD-10-CM | POA: Diagnosis not present

## 2021-06-28 DIAGNOSIS — Z006 Encounter for examination for normal comparison and control in clinical research program: Secondary | ICD-10-CM

## 2021-06-28 HISTORY — DX: Pure hyperglyceridemia: E78.1

## 2021-06-28 HISTORY — DX: Hyperlipidemia, unspecified: E78.5

## 2021-06-28 HISTORY — DX: Essential (primary) hypertension: I10

## 2021-06-28 HISTORY — DX: Tobacco use: Z72.0

## 2021-06-28 HISTORY — DX: Cardiac murmur, unspecified: R01.1

## 2021-06-28 MED ORDER — OLMESARTAN MEDOXOMIL 20 MG PO TABS: 20.0000 mg | ORAL_TABLET | Freq: Every day | ORAL | 3 refills | Status: DC

## 2021-06-28 MED ORDER — ROSUVASTATIN CALCIUM 10 MG PO TABS: 10.0000 mg | ORAL_TABLET | Freq: Every day | ORAL | 3 refills | Status: AC

## 2021-06-28 NOTE — Research (Signed)
Veronica Carter met criteria for the Virtual care and social determinant intervention for the management of hypertension study. The study was discussed with Veronica Carter including risk/benefits. She was given time to read the consent and ask questions. The consent was signed and a copy was given to her. No study related procedures were performed prior to consent being signed. Veronica Carter was randomized to Group 1 advanced hypertension clinic follow-up.

## 2021-06-28 NOTE — Assessment & Plan Note (Signed)
Systolic murmur noted on exam.  It did augment with handgrip and Valsalva.  Concerning for hypertrophic cardiomyopathy.  Given her elevated blood pressures and murmur we will get an echocardiogram to assess.  She has no evidence of heart failure on exam or by history.

## 2021-06-28 NOTE — Assessment & Plan Note (Signed)
Blood pressure is poorly controlled on HCTZ.  She is not ready to quit smoking yet.  She has already limit her sodium intake and tries eat fresh fruits and vegetables.  She is going to work on limiting her carbohydrate intake.  She gets over 15,000 steps daily and does not think she is able to get more exercise.  Therefore she is not interested in the program to the [facility].  She is interested in our remote patient monitoring study and consents to monitoring in the vivify remote patient monitoring system.  She previously did well on olmesartan.  We will add back 20 mg a day and check a basic metabolic panel in a week.  We will also check a TSH at that time.

## 2021-06-28 NOTE — Progress Notes (Signed)
Advanced Hypertension Clinic Initial Assessment:    Date:  06/30/2021   ID:  Veronica Carter, DOB 1961-01-04, MRN [id number]  PCP:  Maury Dus, MD  Cardiologist:  None  Nephrologist:  Referring MD: Servando Salina, MD   CC: Hypertension  History of Present Illness:    Veronica Carter is a 60 y.o. female with a hx of arthritis, heart murmur, and hypertension, here to establish care in the Advanced Hypertension Clinic. She presented to the ED 01/04/2021 with left shoulder pain radiating distally to her left arm, lasting for 1 week. She was hypertensive but was otherwise stable. Her pain was thought to be secondary to cervical radiculopathy with associated muscle spasm. Her blood pressure was 149/79, and had been as high as the 160s/80s. At that time she was on HCTZ. Recently, she saw Dr. Garwin Brothers, and her blood pressure was 169/83 at that visit. She was then referred to the advanced hypertension clinic.  Today, she reports having hypertension since she was 60 yo. Normally her blood pressure is controlled. However, in the past three weeks her blood pressure has become more difficult to control. Sometime she feels a rapid heartbeat, but she then takes a deep breath and calms herself down. 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 In a typical day she is working 14-16 hours, and does not have time for formal exercise. However, she is constantly moving and lifting and has lost some weight. Every day she has aches and joint pains. In 04/2021 she was struggling with LE edema due to the heat. For her diet, she avoids salt but notes she will have to work on reducing carbs. She likes bread and butter, and her son is a Radiographer, therapeutic. Her cooking often includes fresh vegetables. Her caffeine intake is typically one "gigantic" cup of coffee a day. She takes 800 mg Ibuprofen a day, and is allergic to tylenol (rash, pruritis). Vick's vapor rub also seems to help with pain  management. She does not drink much alcohol, but she is a smoker (1/2-1 ppd, about 50 years). Smoking calms her, and she is not interested in quitting at this time. According to her granddaughter, she does snore. Most of the time she is well-rested when she awakes. Her 10-year risk score is 26%. Also, she plans to retire in March 2023. She denies any chest pain, or shortness of breath. No lightheadedness, headaches, syncope, orthopnea, or PND.  Previous antihypertensives: Olmesartan/Benicar - Discontinued when BP improved with weight loss   Past Medical History:  Diagnosis Date   .  Start 06/28/2021   Allergy    dust,pollen, grass, molds   Arthritis    Essential hypertension 06/28/2021   Heart murmur    Hyperlipidemia 06/28/2021   Hypertension    Systolic murmur [id number]   Tobacco abuse 06/28/2021    Past Surgical History:  Procedure Laterality Date   ABDOMINAL HYSTERECTOMY     ABDOMINAL SURGERY  1995   to remove a benign tumor   CERVICAL FUSION  2006   [place] &1984   FOOT TUMOR EXCISION  2010   benign left   PARTIAL HYSTERECTOMY  1994   Has one ovary    Current Medications: Current Meds  Medication Sig   hydrochlorothiazide (HYDRODIURIL) 25 MG tablet Take 1 tablet (25 mg total) by mouth daily.   ibuprofen (ADVIL,MOTRIN) 800 MG tablet Take 1 tablet (800 mg total) by mouth every 8 (eight) hours as needed  for headache or moderate pain.   methocarbamol (ROBAXIN) 500 MG tablet Take 500 mg by mouth as needed for muscle spasms.   olmesartan (BENICAR) 20 MG tablet Take 1 tablet (20 mg total) by mouth daily.   rosuvastatin (CRESTOR) 10 MG tablet Take 1 tablet (10 mg total) by mouth daily.   [DISCONTINUED] methocarbamol (ROBAXIN) 500 MG tablet Take 1 tablet (500 mg total) by mouth 2 (two) times daily. (Patient taking differently: Take 500 mg by mouth every 8 (eight) hours as needed.)     Allergies:   Sulfa antibiotics, Codeine, Hydrocodone, Morphine and related,  Tramadol, Tylenol [acetaminophen], and Demerol [meperidine]   Social History   Socioeconomic History   Marital status: Divorced    Spouse name: Not on file   Number of children: Not on file   Years of education: Not on file   Highest education level: Not on file  Occupational History   Not on file  Tobacco Use   Smoking status: Every Day    Packs/day: 1.00    Types: Cigarettes   Smokeless tobacco: Never  Vaping Use   Vaping Use: Never used  Substance and Sexual Activity   Alcohol use: Yes    Comment: occasional wine   Drug use: No   Sexual activity: Not on file  Other Topics Concern   Not on file  Social History Narrative   Not on file   Social Determinants of Health   Financial Resource Strain: Low Risk    Difficulty of Paying Living Expenses: Not hard at all  Food Insecurity: No Food Insecurity   Worried About Charity fundraiser in the Last Year: Never true   Ran Out of Food in the Last Year: Never true  Transportation Needs: No Transportation Needs   Lack of Transportation (Medical): No   Lack of Transportation (Non-Medical): No  Physical Activity: Inactive   Days of Exercise per Week: 0 days   Minutes of Exercise per Session: 0 min  Stress: Stress Concern Present   Feeling of Stress : To some extent  Social Connections: Not on file     Family History: The patient's family history includes Coronary artery disease in her sister; Dementia in her mother; Hypertension in her mother and sister; Stroke in her maternal uncle. There is no history of Colon cancer, Esophageal cancer, Rectal cancer, or Stomach cancer.  ROS:   Please see the history of present illness.    (+) Stress (+) Joint pain (+) Palpitations (+) Snore (+) Myalgias (+) Joint pain All other systems reviewed and are negative.  EKGs/Labs/Other Studies Reviewed:    EKG:   06/28/2021: Sinus rhythm. Rate 76 bpm. LVH.  Recent Labs: No results found for requested labs within last 8760 hours.    Recent Lipid Panel No results found for: CHOL, TRIG, HDL, CHOLHDL, VLDL, LDLCALC, LDLDIRECT  Physical Exam:   VS:  BP (!) 168/88 (BP Location: Right Arm, Patient Position: Sitting)   Pulse 76   Ht 5' 6.5" (1.689 m)   Wt 208 lb 6.4 oz (94.5 kg)   BMI 33.13 kg/m  , BMI Body mass index is 33.13 kg/m. GENERAL:  Well appearing HEENT: Pupils equal round and reactive, fundi not visualized, oral mucosa unremarkable NECK:  No jugular venous distention, waveform within normal limits, carotid upstroke brisk and symmetric, no bruits, no thyromegaly LYMPHATICS:  No cervical adenopathy LUNGS:  Clear to auscultation bilaterally HEART:  RRR.  PMI not displaced or sustained,S1 and S2 within normal limits, no  S3, no S4, no clicks, no rubs, II/VI systolic murmur at the LUSB ABD:  Flat, positive bowel sounds normal in frequency in pitch, no bruits, no rebound, no guarding, no midline pulsatile mass, no hepatomegaly, no splenomegaly EXT:  2 plus pulses throughout, no edema, no cyanosis no clubbing SKIN:  No rashes no nodules NEURO:  Cranial nerves II through XII grossly intact, motor grossly intact throughout PSYCH:  Cognitively intact, oriented to person place and time   ASSESSMENT/PLAN:    Essential hypertension Blood pressure is poorly controlled on HCTZ.  She is not ready to quit smoking yet.  She has already limit her sodium intake and tries eat fresh fruits and vegetables.  She is going to work on limiting her carbohydrate intake.  She gets over 15,000 steps daily and does not think she is able to get more exercise.  Therefore she is not interested in the program to the [facility].  She is interested in our remote patient monitoring study and consents to monitoring in the vivify remote patient monitoring system.  She previously did well on olmesartan.  We will add back 20 mg a day and check a basic metabolic panel in a week.  We will also check a TSH at that time.  Hyperlipidemia Lipids are poorly  controlled.  Her ASCVD 10-year risk is 26%.  We discussed limiting carbohydrates to improve her triglycerides.  She is not ready to quit smoking.  She is willing to start rosuvastatin 10 mg daily.  Check lipids and a CMP and 2 to 3 months.  Tobacco abuse Not ready to quit smoking.  Systolic murmur Systolic murmur noted on exam.  It did augment with handgrip and Valsalva.  Concerning for hypertrophic cardiomyopathy.  Given her elevated blood pressures and murmur we will get an echocardiogram to assess.  She has no evidence of heart failure on exam or by history.   Screening for Secondary Hypertension:  Causes 06/28/2021  Drugs/Herbals Screened     - Comments 1 coffee/day, ibuprofen  Sleep Apnea Screened  Thyroid Disease Screened    Relevant Labs/Studies: Basic Labs Latest Ref Rng & Units 06/07/2016 11/30/2015 11/29/2015  Sodium 135 - 145 mmol/L 137 140 142  Potassium 3.5 - 5.1 mmol/L 3.6 3.8 3.9  Creatinine 0.44 - 1.00 mg/dL 0.81 0.71 0.70                     Disposition:    FU with PharmD in 1 month.  FU with Lexys Milliner C. Oval Linsey, MD, [facility] in 4 months.  Time spent: 50 minutes-Greater than 50% of this time was spent in counseling, explanation of diagnosis, planning of further management, and coordination of care.   Medication Adjustments/Labs and Tests Ordered: Current medicines are reviewed at length with the patient today.  Concerns regarding medicines are outlined above.  Orders Placed This Encounter  Procedures   Basic metabolic panel   TSH   Hemoglobin A1c   Lipid panel   Comprehensive metabolic panel   EKG [id number]   ECHOCARDIOGRAM COMPLETE    Meds ordered this encounter  Medications   rosuvastatin (CRESTOR) 10 MG tablet    Sig: Take 1 tablet (10 mg total) by mouth daily.    Dispense:  90 tablet    Refill:  3   olmesartan (BENICAR) 20 MG tablet    Sig: Take 1 tablet (20 mg total) by mouth daily.    Dispense:  90 tablet    Refill:  3    I,Mathew  Stumpf,acting  as a Education administrator for [place], MD.,have documented all relevant documentation on the behalf of Skeet Latch, MD,as directed by  Skeet Latch, MD while in the presence of Skeet Latch, MD.  I, Franklin Oval Linsey, MD have reviewed all documentation for this visit.  The documentation of the exam, diagnosis, procedures, and orders on 06/30/2021 are all accurate and complete.   Signed, Skeet Latch, MD  06/30/2021 4:31 PM    [facility]

## 2021-06-28 NOTE — Patient Instructions (Addendum)
Medication Instructions:  START OLMESARTAN 20 MG DAILY   START ROSUVASTATIN 10 MG DAILY    Labwork: BMET/TSH/A1C IN 1 WEEK   FASTING LP/CMET IN 2-3 MONTHS    Testing/Procedures: Your physician has requested that you have an echocardiogram. Echocardiography is a painless test that uses sound waves to create images of your heart. It provides your doctor with information about the size and shape of your heart and how well your heart's chambers and valves are working. This procedure takes approximately one hour. There are no restrictions for this procedure.  [facility] AT [place] [address]   Follow-Up: 08/04/2021 3:30 PM WITH PHARM D AT [facility] [place] [address]   Special Instructions:   MONITOR BLOOD PRESSURE TWICE A DAY WITH MACHINE GIVEN, LOG IN BOOK PROVIDED. BRING MACHINE AND READINGS TO FOLLOW UP   DASH Eating Plan DASH stands for "Dietary Approaches to Stop Hypertension." The DASH eating plan is a healthy eating plan that has been shown to reduce high blood pressure (hypertension). It may also reduce your risk for type 2 diabetes, heart disease, and stroke. The DASH eating plan may also help with weight loss. What are tips for following this plan?  General guidelines Avoid eating more than 2,300 mg (milligrams) of salt (sodium) a day. If you have hypertension, you may need to reduce your sodium intake to 1,500 mg a day. Limit alcohol intake to no more than 1 drink a day for nonpregnant women and 2 drinks a day for men. One drink equals 12 oz of beer, 5 oz of wine, or 1 oz of hard liquor. Work with your health care provider to maintain a healthy body weight or to lose weight. Ask what an ideal weight is for you. Get at least 30 minutes of exercise that causes your heart to beat faster (aerobic exercise) most days of the week. Activities may include walking, swimming, or biking. Work with your health care provider or diet and nutrition specialist  (dietitian) to adjust your eating plan to your individual calorie needs. Reading food labels  Check food labels for the amount of sodium per serving. Choose foods with less than 5 percent of the Daily Value of sodium. Generally, foods with less than 300 mg of sodium per serving fit into this eating plan. To find whole grains, look for the word "whole" as the first word in the ingredient list. Shopping Buy products labeled as "low-sodium" or "no salt added." Buy fresh foods. Avoid canned foods and premade or frozen meals. Cooking Avoid adding salt when cooking. Use salt-free seasonings or herbs instead of table salt or sea salt. Check with your health care provider or pharmacist before using salt substitutes. Do not fry foods. Cook foods using healthy methods such as baking, boiling, grilling, and broiling instead. Cook with heart-healthy oils, such as olive, canola, soybean, or sunflower oil. Meal planning Eat a balanced diet that includes: 5 or more servings of fruits and vegetables each day. At each meal, try to fill half of your plate with fruits and vegetables. Up to 6-8 servings of whole grains each day. Less than 6 oz of lean meat, poultry, or fish each day. A 3-oz serving of meat is about the same size as a deck of cards. One egg equals 1 oz. 2 servings of low-fat dairy each day. A serving of nuts, seeds, or beans 5 times each week. Heart-healthy fats. Healthy fats called Omega-3 fatty acids are found in foods  such as flaxseeds and coldwater fish, like sardines, salmon, and mackerel. Limit how much you eat of the following: Canned or prepackaged foods. Food that is high in trans fat, such as fried foods. Food that is high in saturated fat, such as fatty meat. Sweets, desserts, sugary drinks, and other foods with added sugar. Full-fat dairy products. Do not salt foods before eating. Try to eat at least 2 vegetarian meals each week. Eat more home-cooked food and less restaurant,  buffet, and fast food. When eating at a restaurant, ask that your food be prepared with less salt or no salt, if possible. What foods are recommended? The items listed may not be a complete list. Talk with your dietitian about what dietary choices are best for you. Grains Whole-grain or whole-wheat bread. Whole-grain or whole-wheat pasta. Brown rice. Modena Morrow. Bulgur. Whole-grain and low-sodium cereals. Pita bread. Low-fat, low-sodium crackers. Whole-wheat flour tortillas. Vegetables Fresh or frozen vegetables (raw, steamed, roasted, or grilled). Low-sodium or reduced-sodium tomato and vegetable juice. Low-sodium or reduced-sodium tomato sauce and tomato paste. Low-sodium or reduced-sodium canned vegetables. Fruits All fresh, dried, or frozen fruit. Canned fruit in natural juice (without added sugar). Meat and other protein foods Skinless chicken or [place]. Ground chicken or [place]. Pork with fat trimmed off. Fish and seafood. Egg whites. Dried beans, peas, or lentils. Unsalted nuts, nut butters, and seeds. Unsalted canned beans. Lean cuts of beef with fat trimmed off. Low-sodium, lean deli meat. Dairy Low-fat (1%) or fat-free (skim) milk. Fat-free, low-fat, or reduced-fat cheeses. Nonfat, low-sodium ricotta or cottage cheese. Low-fat or nonfat yogurt. Low-fat, low-sodium cheese. Fats and oils Soft margarine without trans fats. Vegetable oil. Low-fat, reduced-fat, or light mayonnaise and salad dressings (reduced-sodium). Canola, safflower, olive, soybean, and sunflower oils. Avocado. Seasoning and other foods Herbs. Spices. Seasoning mixes without salt. Unsalted popcorn and pretzels. Fat-free sweets. What foods are not recommended? The items listed may not be a complete list. Talk with your dietitian about what dietary choices are best for you. Grains Baked goods made with fat, such as croissants, muffins, or some breads. Dry pasta or rice meal packs. Vegetables Creamed or fried  vegetables. Vegetables in a cheese sauce. Regular canned vegetables (not low-sodium or reduced-sodium). Regular canned tomato sauce and paste (not low-sodium or reduced-sodium). Regular tomato and vegetable juice (not low-sodium or reduced-sodium). Angie Fava. Olives. Fruits Canned fruit in a light or heavy syrup. Fried fruit. Fruit in cream or butter sauce. Meat and other protein foods Fatty cuts of meat. Ribs. Fried meat. Berniece Salines. Sausage. Bologna and other processed lunch meats. Salami. Fatback. Hotdogs. Bratwurst. Salted nuts and seeds. Canned beans with added salt. Canned or smoked fish. Whole eggs or egg yolks. Chicken or [place] with skin. Dairy Whole or 2% milk, cream, and half-and-half. Whole or full-fat cream cheese. Whole-fat or sweetened yogurt. Full-fat cheese. Nondairy creamers. Whipped toppings. Processed cheese and cheese spreads. Fats and oils Butter. Stick margarine. Lard. Shortening. Ghee. Bacon fat. Tropical oils, such as coconut, palm kernel, or palm oil. Seasoning and other foods Salted popcorn and pretzels. Onion salt, garlic salt, seasoned salt, table salt, and sea salt. Worcestershire sauce. Tartar sauce. Barbecue sauce. Teriyaki sauce. Soy sauce, including reduced-sodium. Steak sauce. Canned and packaged gravies. Fish sauce. Oyster sauce. Cocktail sauce. Horseradish that you find on the shelf. Ketchup. Mustard. Meat flavorings and tenderizers. Bouillon cubes. Hot sauce and Tabasco sauce. Premade or packaged marinades. Premade or packaged taco seasonings. Relishes. Regular salad dressings. Where to find more information: National Heart, Lung, and  Blood Institute: https://wilson-eaton.com/ American Heart Association: www.heart.org Summary The DASH eating plan is a healthy eating plan that has been shown to reduce high blood pressure (hypertension). It may also reduce your risk for type 2 diabetes, heart disease, and stroke. With the DASH eating plan, you should limit salt (sodium) intake to  2,300 mg a day. If you have hypertension, you may need to reduce your sodium intake to 1,500 mg a day. When on the DASH eating plan, aim to eat more fresh fruits and vegetables, whole grains, lean proteins, low-fat dairy, and heart-healthy fats. Work with your health care provider or diet and nutrition specialist (dietitian) to adjust your eating plan to your individual calorie needs. This information is not intended to replace advice given to you by your health care provider. Make sure you discuss any questions you have with your health care provider. Document Released: 10/12/2011 Document Revised: 10/05/2017 Document Reviewed: 10/16/2016 Elsevier Patient Education  2020 [employer].

## 2021-06-28 NOTE — Assessment & Plan Note (Signed)
Lipids are poorly controlled.  Her ASCVD 10-year risk is 26%.  We discussed limiting carbohydrates to improve her triglycerides.  She is not ready to quit smoking.  She is willing to start rosuvastatin 10 mg daily.  Check lipids and a CMP and 2 to 3 months.

## 2021-06-28 NOTE — Assessment & Plan Note (Signed)
Not ready to quit smoking.

## 2021-06-30 ENCOUNTER — Encounter (HOSPITAL_BASED_OUTPATIENT_CLINIC_OR_DEPARTMENT_OTHER): Payer: Self-pay | Admitting: Cardiovascular Disease

## 2021-07-19 ENCOUNTER — Ambulatory Visit (HOSPITAL_COMMUNITY): Payer: BC Managed Care – PPO | Attending: Internal Medicine

## 2021-07-19 ENCOUNTER — Other Ambulatory Visit: Payer: Self-pay

## 2021-07-19 DIAGNOSIS — R011 Cardiac murmur, unspecified: Secondary | ICD-10-CM

## 2021-07-19 DIAGNOSIS — I10 Essential (primary) hypertension: Secondary | ICD-10-CM | POA: Diagnosis not present

## 2021-07-19 DIAGNOSIS — E669 Obesity, unspecified: Secondary | ICD-10-CM | POA: Diagnosis not present

## 2021-07-19 LAB — ECHOCARDIOGRAM COMPLETE
Area-P 1/2: 3.22 cm2
S' Lateral: 2.3 cm

## 2021-07-20 LAB — BASIC METABOLIC PANEL
BUN/Creatinine Ratio: 29 — ABNORMAL HIGH (ref 12–28)
BUN: 23 mg/dL (ref 8–27)
CO2: 26 mmol/L (ref 20–29)
Calcium: 9.8 mg/dL (ref 8.7–10.3)
Chloride: 101 mmol/L (ref 96–106)
Creatinine, Ser: 0.78 mg/dL (ref 0.57–1.00)
Glucose: 103 mg/dL — ABNORMAL HIGH (ref 65–99)
Potassium: 3.5 mmol/L (ref 3.5–5.2)
Sodium: 141 mmol/L (ref 134–144)
eGFR: 86 mL/min/{1.73_m2} (ref >59)

## 2021-07-20 LAB — HEMOGLOBIN A1C
Est. average glucose Bld gHb Est-mCnc: 131 mg/dL
Hgb A1c MFr Bld: 6.2 % — ABNORMAL HIGH (ref 4.8–5.6)

## 2021-07-20 LAB — TSH: TSH: 1.64 u[IU]/mL (ref 0.450–4.500)

## 2021-08-04 ENCOUNTER — Ambulatory Visit (INDEPENDENT_AMBULATORY_CARE_PROVIDER_SITE_OTHER): Payer: Self-pay | Admitting: Pharmacist

## 2021-08-04 ENCOUNTER — Other Ambulatory Visit: Payer: Self-pay

## 2021-08-04 ENCOUNTER — Encounter: Payer: Self-pay | Admitting: Pharmacist

## 2021-08-04 VITALS — BP 137/76 | HR 71 | Resp 15 | Ht 66.0 in | Wt 213.6 lb

## 2021-08-04 DIAGNOSIS — I10 Essential (primary) hypertension: Secondary | ICD-10-CM

## 2021-08-04 NOTE — Progress Notes (Signed)
Patient ID: Veronica Carter                 DOB: 05-15-1961                      MRN: [id number]     HPI: BRIGHTON DELIO is a 60 y.o. female referred by Dr. Oval Linsey to HTN clinic. PMH is significant for HTN, HLD, and tobacco use. Patient saw Dr. Oval Linsey on 8/23 with uncontrolled HTN only managed on HCTZ.  Olmesartan 20mg  was added to regimen and patient was enrolled in Adv HTN study.  Patient presents today for first PharmD visit one month post visit with Dr. Oval Linsey.  Brought BP log with her. Has been checking twice daily using Micah Flesher cuff.  Has been using her right arm. Is not always angling the tubing and artery marker correctly.  Reports occasional dizziness and "floaters" in past month.  Perhaps 2-3 times.   Current HTN meds:  HCTZ 25mg  daily,  Olmesartan 20mg  daily  BP goal: <130/80  Home BP readings:   9/29: 149/90 9/28: 147/85, 138/79 9/27: 140/82, 132/75 9/26: 142/82, 144/82 9/25: 146/81, 123/90 9/24: 161/94, 136/73   Wt Readings from Last 3 Encounters:  06/28/21 208 lb 6.4 oz (94.5 kg)  01/04/21 204 lb (92.5 kg)  10/24/19 217 lb (98.4 kg)   BP Readings from Last 3 Encounters:  06/28/21 (!) 168/88  01/04/21 (!) 149/79  10/24/19 (!) 165/80   Pulse Readings from Last 3 Encounters:  06/28/21 76  01/04/21 65  10/24/19 74    Renal function: CrCl cannot be calculated (Unknown ideal weight.).  Past Medical History:  Diagnosis Date   .  Start 06/28/2021   Allergy    dust,pollen, grass, molds   Arthritis    Essential hypertension 06/28/2021   Heart murmur    Hyperlipidemia 06/28/2021   Hypertension    Systolic murmur 9/32/6712   Tobacco abuse 06/28/2021    Current Outpatient Medications on File Prior to Visit  Medication Sig Dispense Refill   hydrochlorothiazide (HYDRODIURIL) 25 MG tablet Take 1 tablet (25 mg total) by mouth daily. 30 tablet 0   ibuprofen (ADVIL,MOTRIN) 800 MG tablet Take 1 tablet (800 mg total) by mouth every 8 (eight) hours as  needed for headache or moderate pain. 15 tablet 0   methocarbamol (ROBAXIN) 500 MG tablet Take 500 mg by mouth as needed for muscle spasms.     olmesartan (BENICAR) 20 MG tablet Take 1 tablet (20 mg total) by mouth daily. 90 tablet 3   rosuvastatin (CRESTOR) 10 MG tablet Take 1 tablet (10 mg total) by mouth daily. 90 tablet 3   No current facility-administered medications on file prior to visit.    Allergies  Allergen Reactions   Sulfa Antibiotics Anaphylaxis   Codeine Hives and Swelling    Joints swelled and neck swelled   Hydrocodone Hives and Itching   Morphine And Related Other (See Comments)    Burns her skin   Tramadol Itching and Swelling   Tylenol [Acetaminophen] Hives and Itching   Demerol [Meperidine] Rash     Assessment/Plan:  1. Hypertension -  Patient home readings over goal.  However, after watching patient's technique, was using right arm and not aiming tubing over artery.  Using left arm and patient's home cuff properly affixed, BP was 137/82 which is slightly above goal of <130/80.  Verified with electronic machine from [place] clinic: 137/76.   Manual reading 120/74 which is at  goal.  However, measured patient's arm at 33cm and her cuff and Adv Htn machine fit patient's arm better.  Since patient now knows better technique, will continue current medications and continue home monitoring.  Recheck in 1 month.  Continue olmesartan 20mg  daily Continue HCTZ 25mg  daily Recheck in 4 weeks  Karren Cobble, PharmD, BCACP, [place], [place] [address] [place], [place] [address] Phone: [phone]; Fax: [phone] 08/04/2021 8:01 PM

## 2021-08-04 NOTE — Patient Instructions (Signed)
It was nice meeting you today  We would like your blood pressure to be less than 130/80  Continue your olmesartan 20mg  daily and hydrochlorothiazide 25mg  daily  Continue to monitor your blood pressure at home  Please call with any questions   Karren Cobble, PharmD, BCACP, [place], [place] [address], [place], [place] [address] Phone: [phone]; Fax: [phone] 08/04/2021 4:24 PM

## 2021-09-06 ENCOUNTER — Ambulatory Visit: Payer: BC Managed Care – PPO

## 2021-09-27 ENCOUNTER — Ambulatory Visit: Payer: Self-pay

## 2021-09-27 NOTE — Progress Notes (Incomplete)
° ° ° °  09/27/2021 Veronica Carter [birth year] [id number]   HPI:  Veronica Carter is a 60 y.o. female patient of Dr Oval Linsey, with a PMH below who presents today for advanced hypertension clinic follow up.  She was referred to the clinic by Dr. Garwin Brothers, when pressure was found to be at 169/83.  When she saw Dr Sandrea Matte  Past Medical History:                   Blood Pressure Goal:  130/80  Current Medications:  Family Hx:  Social Hx:   Diet:   Exercise:   Home BP readings:   Intolerances:   Labs:    Wt Readings from Last 3 Encounters:  08/04/21 213 lb 9.6 oz (96.9 kg)  06/28/21 208 lb 6.4 oz (94.5 kg)  01/04/21 204 lb (92.5 kg)   BP Readings from Last 3 Encounters:  08/04/21 137/76  06/28/21 (!) 168/88  01/04/21 (!) 149/79   Pulse Readings from Last 3 Encounters:  08/04/21 71  06/28/21 76  01/04/21 65    Current Outpatient Medications  Medication Sig Dispense Refill   hydrochlorothiazide (HYDRODIURIL) 25 MG tablet Take 1 tablet (25 mg total) by mouth daily. 30 tablet 0   ibuprofen (ADVIL,MOTRIN) 800 MG tablet Take 1 tablet (800 mg total) by mouth every 8 (eight) hours as needed for headache or moderate pain. 15 tablet 0   methocarbamol (ROBAXIN) 500 MG tablet Take 500 mg by mouth as needed for muscle spasms.     olmesartan (BENICAR) 20 MG tablet Take 1 tablet (20 mg total) by mouth daily. 90 tablet 3   rosuvastatin (CRESTOR) 10 MG tablet Take 1 tablet (10 mg total) by mouth daily. 90 tablet 3   No current facility-administered medications for this visit.    Allergies  Allergen Reactions   Sulfa Antibiotics Anaphylaxis   Codeine Hives and Swelling    Joints swelled and neck swelled   Hydrocodone Hives and Itching   Morphine And Related Other (See Comments)    Burns her skin   Tramadol Itching and Swelling   Tylenol [Acetaminophen] Hives and Itching   Demerol [Meperidine] Rash    Past Medical History:  Diagnosis Date   .  Start 06/28/2021   Allergy     dust,pollen, grass, molds   Arthritis    Essential hypertension 06/28/2021   Heart murmur    Hyperlipidemia 06/28/2021   Hypertension    Systolic murmur 9/89/2119   Tobacco abuse 06/28/2021    There were no vitals taken for this visit.  No problem-specific Assessment & Plan notes found for this encounter.   Tommy Medal PharmD CPP [facility] [address] [place], [place] [address] [phone]

## 2022-01-10 ENCOUNTER — Telehealth: Payer: Self-pay

## 2022-01-10 DIAGNOSIS — Z006 Encounter for examination for normal comparison and control in clinical research program: Secondary | ICD-10-CM

## 2022-01-10 NOTE — Telephone Encounter (Signed)
I have attempted without success to contact this patient by phone for Virtual care and social determinant interventions for management of HTN Trail follow up. I left a message for patient to return my phone call with my name and callback number. An e-mail was also sent to patient.  ? ?

## 2022-05-17 ENCOUNTER — Other Ambulatory Visit: Payer: Self-pay | Admitting: Family Medicine

## 2022-05-17 DIAGNOSIS — Z1231 Encounter for screening mammogram for malignant neoplasm of breast: Secondary | ICD-10-CM

## 2022-06-19 ENCOUNTER — Ambulatory Visit
Admission: RE | Admit: 2022-06-19 | Discharge: 2022-06-19 | Disposition: A | Discharge status: Home / Self Care | Payer: BC Managed Care – PPO | Source: Ambulatory Visit | Attending: Family Medicine | Admitting: Family Medicine

## 2022-06-19 DIAGNOSIS — Z1231 Encounter for screening mammogram for malignant neoplasm of breast: Secondary | ICD-10-CM

## 2022-06-21 ENCOUNTER — Other Ambulatory Visit: Payer: Self-pay | Admitting: Family Medicine

## 2022-06-21 DIAGNOSIS — R928 Other abnormal and inconclusive findings on diagnostic imaging of breast: Secondary | ICD-10-CM

## 2022-06-30 ENCOUNTER — Ambulatory Visit
Admission: RE | Admit: 2022-06-30 | Discharge: 2022-06-30 | Disposition: A | Discharge status: Home / Self Care | Payer: BC Managed Care – PPO | Source: Ambulatory Visit | Attending: Family Medicine | Admitting: Family Medicine

## 2022-06-30 ENCOUNTER — Other Ambulatory Visit: Payer: Self-pay | Admitting: Family Medicine

## 2022-06-30 DIAGNOSIS — R928 Other abnormal and inconclusive findings on diagnostic imaging of breast: Secondary | ICD-10-CM

## 2022-06-30 DIAGNOSIS — N6320 Unspecified lump in the left breast, unspecified quadrant: Secondary | ICD-10-CM

## 2022-07-05 ENCOUNTER — Ambulatory Visit
Admission: RE | Admit: 2022-07-05 | Discharge: 2022-07-05 | Disposition: A | Discharge status: Home / Self Care | Payer: BC Managed Care – PPO | Source: Ambulatory Visit | Attending: Family Medicine | Admitting: Family Medicine

## 2022-07-05 DIAGNOSIS — N6320 Unspecified lump in the left breast, unspecified quadrant: Secondary | ICD-10-CM

## 2022-07-17 ENCOUNTER — Ambulatory Visit: Payer: Self-pay | Admitting: Surgery

## 2022-07-17 DIAGNOSIS — D242 Benign neoplasm of left breast: Secondary | ICD-10-CM

## 2022-07-20 ENCOUNTER — Other Ambulatory Visit: Payer: Self-pay | Admitting: Surgery

## 2022-07-20 DIAGNOSIS — D242 Benign neoplasm of left breast: Secondary | ICD-10-CM

## 2022-08-14 ENCOUNTER — Other Ambulatory Visit: Payer: Self-pay

## 2022-08-14 ENCOUNTER — Encounter (HOSPITAL_BASED_OUTPATIENT_CLINIC_OR_DEPARTMENT_OTHER): Payer: Self-pay | Admitting: Surgery

## 2022-08-17 ENCOUNTER — Encounter (HOSPITAL_BASED_OUTPATIENT_CLINIC_OR_DEPARTMENT_OTHER)
Admission: RE | Admit: 2022-08-17 | Discharge: 2022-08-17 | Disposition: A | Discharge status: Home / Self Care | Payer: BC Managed Care – PPO | Source: Ambulatory Visit | Attending: Surgery | Admitting: Surgery

## 2022-08-17 DIAGNOSIS — I10 Essential (primary) hypertension: Secondary | ICD-10-CM | POA: Diagnosis not present

## 2022-08-17 DIAGNOSIS — M1990 Unspecified osteoarthritis, unspecified site: Secondary | ICD-10-CM | POA: Diagnosis not present

## 2022-08-17 DIAGNOSIS — R920 Mammographic microcalcification found on diagnostic imaging of breast: Secondary | ICD-10-CM | POA: Diagnosis not present

## 2022-08-17 DIAGNOSIS — I517 Cardiomegaly: Secondary | ICD-10-CM | POA: Diagnosis not present

## 2022-08-17 DIAGNOSIS — Z79899 Other long term (current) drug therapy: Secondary | ICD-10-CM | POA: Diagnosis not present

## 2022-08-17 DIAGNOSIS — F17200 Nicotine dependence, unspecified, uncomplicated: Secondary | ICD-10-CM | POA: Diagnosis not present

## 2022-08-17 DIAGNOSIS — D242 Benign neoplasm of left breast: Secondary | ICD-10-CM | POA: Diagnosis present

## 2022-08-17 LAB — BASIC METABOLIC PANEL
Anion gap: 7 (ref 5–15)
BUN: 11 mg/dL (ref 8–23)
CO2: 26 mmol/L (ref 22–32)
Calcium: 9.1 mg/dL (ref 8.9–10.3)
Chloride: 105 mmol/L (ref 98–111)
Creatinine, Ser: 0.74 mg/dL (ref 0.44–1.00)
GFR, Estimated: >60 mL/min (ref >60)
Glucose, Bld: 106 mg/dL — ABNORMAL HIGH (ref 70–99)
Potassium: 3.6 mmol/L (ref 3.5–5.1)
Sodium: 138 mmol/L (ref 135–145)

## 2022-08-17 NOTE — Progress Notes (Signed)

## 2022-08-21 ENCOUNTER — Ambulatory Visit
Admission: RE | Admit: 2022-08-21 | Discharge: 2022-08-21 | Disposition: A | Discharge status: Home / Self Care | Payer: BC Managed Care – PPO | Source: Ambulatory Visit | Attending: Surgery | Admitting: Surgery

## 2022-08-21 DIAGNOSIS — D242 Benign neoplasm of left breast: Secondary | ICD-10-CM

## 2022-08-22 ENCOUNTER — Ambulatory Visit (HOSPITAL_BASED_OUTPATIENT_CLINIC_OR_DEPARTMENT_OTHER): Payer: BC Managed Care – PPO | Admitting: Anesthesiology

## 2022-08-22 ENCOUNTER — Encounter (HOSPITAL_BASED_OUTPATIENT_CLINIC_OR_DEPARTMENT_OTHER): Payer: Self-pay | Admitting: Surgery

## 2022-08-22 ENCOUNTER — Other Ambulatory Visit: Payer: Self-pay

## 2022-08-22 ENCOUNTER — Ambulatory Visit
Admission: RE | Admit: 2022-08-22 | Discharge: 2022-08-22 | Disposition: A | Discharge status: Home / Self Care | Payer: BC Managed Care – PPO | Source: Ambulatory Visit | Attending: Surgery | Admitting: Surgery

## 2022-08-22 ENCOUNTER — Encounter (HOSPITAL_BASED_OUTPATIENT_CLINIC_OR_DEPARTMENT_OTHER)
Admission: RE | Disposition: A | Discharge status: Home / Self Care | Payer: Self-pay | Source: Ambulatory Visit | Attending: Surgery

## 2022-08-22 ENCOUNTER — Ambulatory Visit (HOSPITAL_BASED_OUTPATIENT_CLINIC_OR_DEPARTMENT_OTHER)
Admission: RE | Admit: 2022-08-22 | Discharge: 2022-08-22 | Disposition: A | Discharge status: Home / Self Care | Payer: BC Managed Care – PPO | Source: Ambulatory Visit | Attending: Surgery | Admitting: Surgery

## 2022-08-22 DIAGNOSIS — D242 Benign neoplasm of left breast: Secondary | ICD-10-CM | POA: Insufficient documentation

## 2022-08-22 DIAGNOSIS — I517 Cardiomegaly: Secondary | ICD-10-CM | POA: Insufficient documentation

## 2022-08-22 DIAGNOSIS — Z01818 Encounter for other preprocedural examination: Secondary | ICD-10-CM

## 2022-08-22 DIAGNOSIS — I10 Essential (primary) hypertension: Secondary | ICD-10-CM | POA: Insufficient documentation

## 2022-08-22 DIAGNOSIS — R920 Mammographic microcalcification found on diagnostic imaging of breast: Secondary | ICD-10-CM | POA: Insufficient documentation

## 2022-08-22 DIAGNOSIS — F17200 Nicotine dependence, unspecified, uncomplicated: Secondary | ICD-10-CM | POA: Insufficient documentation

## 2022-08-22 DIAGNOSIS — M1990 Unspecified osteoarthritis, unspecified site: Secondary | ICD-10-CM | POA: Insufficient documentation

## 2022-08-22 DIAGNOSIS — Z79899 Other long term (current) drug therapy: Secondary | ICD-10-CM | POA: Insufficient documentation

## 2022-08-22 HISTORY — PX: BREAST LUMPECTOMY WITH RADIOACTIVE SEED LOCALIZATION: SHX6424

## 2022-08-22 SURGERY — BREAST LUMPECTOMY WITH RADIOACTIVE SEED LOCALIZATION
Anesthesia: General | Site: Breast | Laterality: Left

## 2022-08-22 MED ORDER — IBUPROFEN 800 MG PO TABS: 800.0000 mg | ORAL_TABLET | Freq: Three times a day (TID) | ORAL | 0 refills | Status: AC | PRN

## 2022-08-22 MED ORDER — PROPOFOL 500 MG/50ML IV EMUL
INTRAVENOUS | Status: DC | PRN
  Administered 2022-08-22: 50 ug/kg/min via INTRAVENOUS

## 2022-08-22 MED ORDER — CHLORHEXIDINE GLUCONATE CLOTH 2 % EX PADS: 6.0000 | MEDICATED_PAD | Freq: Once | CUTANEOUS | Status: DC

## 2022-08-22 MED ORDER — 0.9 % SODIUM CHLORIDE (POUR BTL) OPTIME
TOPICAL | Status: DC | PRN
  Administered 2022-08-22: 200 mL

## 2022-08-22 MED ORDER — SODIUM CHLORIDE 0.9 % IV SOLN
INTRAVENOUS | Status: AC
  Filled 2022-08-22: qty 10

## 2022-08-22 MED ORDER — BUPIVACAINE-EPINEPHRINE (PF) 0.25% -1:200000 IJ SOLN
INTRAMUSCULAR | Status: DC | PRN
  Administered 2022-08-22: 20 mL

## 2022-08-22 MED ORDER — FENTANYL CITRATE (PF) 100 MCG/2ML IJ SOLN
INTRAMUSCULAR | Status: DC | PRN
  Administered 2022-08-22 (×2): 50 ug via INTRAVENOUS

## 2022-08-22 MED ORDER — ONDANSETRON HCL 4 MG/2ML IJ SOLN
INTRAMUSCULAR | Status: DC | PRN
  Administered 2022-08-22: 4 mg via INTRAVENOUS

## 2022-08-22 MED ORDER — FENTANYL CITRATE (PF) 100 MCG/2ML IJ SOLN: 25.0000 ug | INTRAMUSCULAR | Status: DC | PRN

## 2022-08-22 MED ORDER — DEXAMETHASONE SODIUM PHOSPHATE 4 MG/ML IJ SOLN
INTRAMUSCULAR | Status: DC | PRN
  Administered 2022-08-22: 4 mg via INTRAVENOUS

## 2022-08-22 MED ORDER — MIDAZOLAM HCL 2 MG/2ML IJ SOLN
INTRAMUSCULAR | Status: AC
  Filled 2022-08-22: qty 2

## 2022-08-22 MED ORDER — GABAPENTIN 300 MG PO CAPS
ORAL_CAPSULE | ORAL | Status: AC
  Filled 2022-08-22: qty 1

## 2022-08-22 MED ORDER — CEFAZOLIN IN SODIUM CHLORIDE 3-0.9 GM/100ML-% IV SOLN
3.0000 g | INTRAVENOUS | Status: AC
  Administered 2022-08-22: 2 g via INTRAVENOUS

## 2022-08-22 MED ORDER — MIDAZOLAM HCL 5 MG/5ML IJ SOLN
INTRAMUSCULAR | Status: DC | PRN
  Administered 2022-08-22: 2 mg via INTRAVENOUS

## 2022-08-22 MED ORDER — CEFAZOLIN SODIUM-DEXTROSE 2-4 GM/100ML-% IV SOLN
INTRAVENOUS | Status: AC
  Filled 2022-08-22: qty 100

## 2022-08-22 MED ORDER — FENTANYL CITRATE (PF) 100 MCG/2ML IJ SOLN
INTRAMUSCULAR | Status: AC
  Filled 2022-08-22: qty 2

## 2022-08-22 MED ORDER — SODIUM CHLORIDE 0.9 % IV SOLN
INTRAVENOUS | Status: DC | PRN
  Administered 2022-08-22: 500 mL

## 2022-08-22 MED ORDER — ONDANSETRON HCL 4 MG/2ML IJ SOLN
INTRAMUSCULAR | Status: AC
  Filled 2022-08-22: qty 2

## 2022-08-22 MED ORDER — LIDOCAINE 2% (20 MG/ML) 5 ML SYRINGE
INTRAMUSCULAR | Status: AC
  Filled 2022-08-22: qty 5

## 2022-08-22 MED ORDER — GABAPENTIN 300 MG PO CAPS
300.0000 mg | ORAL_CAPSULE | ORAL | Status: AC
  Administered 2022-08-22: 300 mg via ORAL

## 2022-08-22 MED ORDER — DEXAMETHASONE SODIUM PHOSPHATE 10 MG/ML IJ SOLN
INTRAMUSCULAR | Status: AC
  Filled 2022-08-22: qty 1

## 2022-08-22 MED ORDER — PROPOFOL 10 MG/ML IV BOLUS
INTRAVENOUS | Status: DC | PRN
  Administered 2022-08-22: 160 mg via INTRAVENOUS
  Administered 2022-08-22: 20 mg via INTRAVENOUS

## 2022-08-22 MED ORDER — LACTATED RINGERS IV SOLN: INTRAVENOUS | Status: DC

## 2022-08-22 SURGICAL SUPPLY — 49 items
ADH SKN CLS APL DERMABOND .7 (GAUZE/BANDAGES/DRESSINGS) ×1
APL PRP STRL LF DISP 70% ISPRP (MISCELLANEOUS) ×1
APPLIER CLIP 9.375 MED OPEN (MISCELLANEOUS)
APR CLP MED 9.3 20 MLT OPN (MISCELLANEOUS)
BINDER BREAST LRG (GAUZE/BANDAGES/DRESSINGS) IMPLANT
BINDER BREAST MEDIUM (GAUZE/BANDAGES/DRESSINGS) IMPLANT
BINDER BREAST XLRG (GAUZE/BANDAGES/DRESSINGS) IMPLANT
BINDER BREAST XXLRG (GAUZE/BANDAGES/DRESSINGS) IMPLANT
BLADE SURG 15 STRL LF DISP TIS (BLADE) ×2 IMPLANT
BLADE SURG 15 STRL SS (BLADE) ×1
CANISTER SUC SOCK COL 7IN (MISCELLANEOUS) IMPLANT
CANISTER SUCT 1200ML W/VALVE (MISCELLANEOUS) IMPLANT
CHLORAPREP W/TINT 26 (MISCELLANEOUS) ×2 IMPLANT
CLIP APPLIE 9.375 MED OPEN (MISCELLANEOUS) IMPLANT
COVER BACK TABLE 60X90IN (DRAPES) ×2 IMPLANT
COVER MAYO STAND STRL (DRAPES) ×2 IMPLANT
COVER PROBE W GEL 5X96 (DRAPES) ×2 IMPLANT
DERMABOND ADVANCED .7 DNX12 (GAUZE/BANDAGES/DRESSINGS) ×2 IMPLANT
DRAPE LAPAROSCOPIC ABDOMINAL (DRAPES) IMPLANT
DRAPE LAPAROTOMY 100X72 PEDS (DRAPES) ×2 IMPLANT
DRAPE UTILITY XL STRL (DRAPES) ×2 IMPLANT
ELECT COATED BLADE 2.86 ST (ELECTRODE) ×2 IMPLANT
ELECT REM PT RETURN 9FT ADLT (ELECTROSURGICAL) ×1
ELECTRODE REM PT RTRN 9FT ADLT (ELECTROSURGICAL) ×2 IMPLANT
GLOVE BIOGEL PI IND STRL 8 (GLOVE) ×2 IMPLANT
GLOVE ECLIPSE 8.0 STRL XLNG CF (GLOVE) ×2 IMPLANT
GOWN STRL REUS W/ TWL LRG LVL3 (GOWN DISPOSABLE) ×4 IMPLANT
GOWN STRL REUS W/ TWL XL LVL3 (GOWN DISPOSABLE) ×2 IMPLANT
GOWN STRL REUS W/TWL LRG LVL3 (GOWN DISPOSABLE) ×2
GOWN STRL REUS W/TWL XL LVL3 (GOWN DISPOSABLE) ×1
HEMOSTAT ARISTA ABSORB 3G PWDR (HEMOSTASIS) IMPLANT
HEMOSTAT SNOW SURGICEL 2X4 (HEMOSTASIS) IMPLANT
KIT MARKER MARGIN INK (KITS) ×2 IMPLANT
NDL HYPO 25X1 1.5 SAFETY (NEEDLE) ×2 IMPLANT
NEEDLE HYPO 25X1 1.5 SAFETY (NEEDLE) ×1 IMPLANT
NS IRRIG 1000ML POUR BTL (IV SOLUTION) ×2 IMPLANT
PACK BASIN DAY SURGERY FS (CUSTOM PROCEDURE TRAY) ×2 IMPLANT
PENCIL SMOKE EVACUATOR (MISCELLANEOUS) ×2 IMPLANT
SLEEVE SCD COMPRESS KNEE MED (STOCKING) ×2 IMPLANT
SPIKE FLUID TRANSFER (MISCELLANEOUS) IMPLANT
SPONGE T-LAP 4X18 ~~LOC~~+RFID (SPONGE) ×2 IMPLANT
SUT MNCRL AB 4-0 PS2 18 (SUTURE) ×2 IMPLANT
SUT SILK 2 0 SH (SUTURE) IMPLANT
SUT VICRYL 3-0 CR8 SH (SUTURE) ×2 IMPLANT
SYR CONTROL 10ML LL (SYRINGE) ×2 IMPLANT
TOWEL GREEN STERILE FF (TOWEL DISPOSABLE) ×2 IMPLANT
TRAY FAXITRON CT DISP (TRAY / TRAY PROCEDURE) ×2 IMPLANT
TUBE CONNECTING 20X1/4 (TUBING) IMPLANT
YANKAUER SUCT BULB TIP NO VENT (SUCTIONS) IMPLANT

## 2022-08-22 NOTE — Anesthesia Procedure Notes (Signed)
Procedure Name: LMA Insertion Date/Time: 08/22/2022 9:06 AM  Performed by: Maryella Shivers, CRNAPre-anesthesia Checklist: Patient identified, Emergency Drugs available, Suction available and Patient being monitored Patient Re-evaluated:Patient Re-evaluated prior to induction Oxygen Delivery Method: Circle system utilized Preoxygenation: Pre-oxygenation with 100% oxygen Induction Type: IV induction Ventilation: Mask ventilation without difficulty LMA: LMA inserted LMA Size: 4.0 Number of attempts: 1 Airway Equipment and Method: Bite block Placement Confirmation: positive ETCO2 Tube secured with: Tape Dental Injury: Teeth and Oropharynx as per pre-operative assessment

## 2022-08-22 NOTE — Interval H&P Note (Signed)
History and Physical Interval Note:  08/22/2022 8:38 AM  Veronica Carter  has presented today for surgery, with the diagnosis of LEFT BREAST PAPILLOMA.  The various methods of treatment have been discussed with the patient and family. After consideration of risks, benefits and other options for treatment, the patient has consented to  Procedure(s): LEFT BREAST LUMPECTOMY WITH RADIOACTIVE SEED LOCALIZATION (Left) as a surgical intervention.  The patient's history has been reviewed, patient examined, no change in status, stable for surgery.  I have reviewed the patient's chart and labs.  Questions were answered to the patient's satisfaction.     Turner Daniels MD

## 2022-08-22 NOTE — Transfer of Care (Signed)
Immediate Anesthesia Transfer of Care Note  Patient: Veronica Carter  Procedure(s) Performed: LEFT BREAST LUMPECTOMY WITH RADIOACTIVE SEED LOCALIZATION (Left: Breast)  Patient Location: PACU  Anesthesia Type:General  Level of Consciousness: sedated  Airway & Oxygen Therapy: Patient Spontanous Breathing and Patient connected to face mask oxygen  Post-op Assessment: Report given to RN and Post -op Vital signs reviewed and stable  Post vital signs: Reviewed and stable  Last Vitals:  Vitals Value Taken Time  BP    Temp    Pulse 92 08/22/22 0948  Resp    SpO2 99 % 08/22/22 0948  Vitals shown include unvalidated device data.  Last Pain:  Vitals:   08/22/22 0743  TempSrc: Oral  PainSc: 0-No pain         Complications: No notable events documented.

## 2022-08-22 NOTE — Discharge Instructions (Addendum)
[facility] Office Phone Number [phone]  BREAST BIOPSY/ PARTIAL MASTECTOMY: POST OP INSTRUCTIONS  Always review your discharge instruction sheet given to you by the facility where your surgery was performed.  IF YOU HAVE DISABILITY OR FAMILY LEAVE FORMS, YOU MUST BRING THEM TO THE OFFICE FOR PROCESSING.  DO NOT GIVE THEM TO YOUR DOCTOR.  A prescription for pain medication may be given to you upon discharge.  Take your pain medication as prescribed, if needed.  If narcotic pain medicine is not needed, then you may take acetaminophen (Tylenol) or ibuprofen (Advil) as needed. Take your usually prescribed medications unless otherwise directed If you need a refill on your pain medication, please contact your pharmacy.  They will contact our office to request authorization.  Prescriptions will not be filled after 5pm or on week-ends. You should eat very light the first 24 hours after surgery, such as soup, crackers, pudding, etc.  Resume your normal diet the day after surgery. Most patients will experience some swelling and bruising in the breast.  Ice packs and a good support bra will help.  Swelling and bruising can take several days to resolve.  It is common to experience some constipation if taking pain medication after surgery.  Increasing fluid intake and taking a stool softener will usually help or prevent this problem from occurring.  A mild laxative (Milk of Magnesia or Miralax) should be taken according to package directions if there are no bowel movements after 48 hours. Unless discharge instructions indicate otherwise, you may remove your bandages 24-48 hours after surgery, and you may shower at that time.  You may have steri-strips (small skin tapes) in place directly over the incision.  These strips should be left on the skin for 7-10 days.  If your surgeon used skin glue on the incision, you may shower in 24 hours.  The glue will flake off over the next 2-3 weeks.  Any  sutures or staples will be removed at the office during your follow-up visit. ACTIVITIES:  You may resume regular daily activities (gradually increasing) beginning the next day.  Wearing a good support bra or sports bra minimizes pain and swelling.  You may have sexual intercourse when it is comfortable. You may drive when you no longer are taking prescription pain medication, you can comfortably wear a seatbelt, and you can safely maneuver your car and apply brakes. RETURN TO WORK:  ______________________________________________________________________________________ You should see your doctor in the office for a follow-up appointment approximately two weeks after your surgery.  Your doctor's nurse will typically make your follow-up appointment when she calls you with your pathology report.  Expect your pathology report 2-3 business days after your surgery.  You may call to check if you do not hear from us after three days. OTHER INSTRUCTIONS: _______________________________________________________________________________________________ _____________________________________________________________________________________________________________________________________ _____________________________________________________________________________________________________________________________________ _____________________________________________________________________________________________________________________________________  WHEN TO CALL YOUR DOCTOR: Fever over 101.0 Nausea and/or vomiting. Extreme swelling or bruising. Continued bleeding from incision. Increased pain, redness, or drainage from the incision.  The clinic staff is available to answer your questions during regular business hours.  Please don't hesitate to call and ask to speak to one of the nurses for clinical concerns.  If you have a medical emergency, go to the nearest emergency room or call 911.  A surgeon from [facility] is always on call at the hospital.  For further questions, please visit [email or web address]    Post Anesthesia Home Care Instructions  Activity: Get plenty of rest for the remainder of   the day. A responsible individual must stay with you for 24 hours following the procedure.  For the next 24 hours, DO NOT: -Drive a car -Operate machinery -Drink alcoholic beverages -Take any medication unless instructed by your physician -Make any legal decisions or sign important papers.  Meals: Start with liquid foods such as gelatin or soup. Progress to regular foods as tolerated. Avoid greasy, spicy, heavy foods. If nausea and/or vomiting occur, drink only clear liquids until the nausea and/or vomiting subsides. Call your physician if vomiting continues.  Special Instructions/Symptoms: Your throat may feel dry or sore from the anesthesia or the breathing tube placed in your throat during surgery. If this causes discomfort, gargle with warm salt water. The discomfort should disappear within 24 hours.  If you had a scopolamine patch placed behind your ear for the management of post- operative nausea and/or vomiting:  1. The medication in the patch is effective for 72 hours, after which it should be removed.  Wrap patch in a tissue and discard in the trash. Wash hands thoroughly with soap and water. 2. You may remove the patch earlier than 72 hours if you experience unpleasant side effects which may include dry mouth, dizziness or visual disturbances. 3. Avoid touching the patch. Wash your hands with soap and water after contact with the patch.      

## 2022-08-22 NOTE — Op Note (Signed)
Preoperative diagnosis: Left breast papilloma upper outer quadrant  Postoperative diagnosis: Same  Procedure: Left breast seed localized lumpectomy  Surgeon: Erroll Luna, MD  Anesthesia: LMA with 0.25% Marcaine with epinephrine  EBL: Minimal  Specimen: Left breast tissue with seed and clip verified by Faxitron  Indications for procedure: The patient is a 61 year old female with a left breast mammographic abnormality.  Core biopsy was performed which showed papilloma.  We discussed the pros and cons and natural history of papilloma and potential upgrade risk to malignancy in the circumstance.  We discussed the low risk of this but this was not 0.  The patient desired excision due to the above conversation.  Risks and benefits of surgery were reviewed.The procedure has been discussed with the patient. Alternatives to surgery have been discussed with the patient.  Risks of surgery include bleeding,  Infection,  Seroma formation, death,  and the need for further surgery.   The patient understands and wishes to proceed.   Description of procedure: The patient was met in the holding area.  The left breast was marked as the correct side.  All questions were answered.  She was taken back to the operating room.  She was placed upon upon the OR table.  After induction of general esthesia, the left breast was prepped and draped in a sterile fashion timeout performed.  Her films with her seed placement were available for review.  Timeout was performed.  Neoprobe was used to identify the seed left breast in the upper outer quadrant.  A curvilinear incision was made over this.  Dissection was carried down all tissue and the seed and clip were excised with a grossly negative margin.  The cavity then irrigated.  The specimen was painted and then images revealed both seed and clip to be in the specimen.  This was sent on to pathology.  Hemostasis was achieved with cautery.  We infiltrated the cavity with 0.25%  Marcaine with epinephrine.  Cavity was then closed with a deep layer 3-0 Vicryl.  4 Monocryl was used to close the skin in a subcuticular fashion.  Dermabond was applied.  All counts were found to be correct.  The patient was awoke extubated taken to recovery in satisfactory condition.

## 2022-08-22 NOTE — Anesthesia Postprocedure Evaluation (Signed)
Anesthesia Post Note  Patient: Veronica Carter  Procedure(s) Performed: LEFT BREAST LUMPECTOMY WITH RADIOACTIVE SEED LOCALIZATION (Left: Breast)     Patient location during evaluation: PACU Anesthesia Type: General Level of consciousness: awake and alert Pain management: pain level controlled Vital Signs Assessment: post-procedure vital signs reviewed and stable Respiratory status: spontaneous breathing, nonlabored ventilation, respiratory function stable and patient connected to nasal cannula oxygen Cardiovascular status: blood pressure returned to baseline and stable Postop Assessment: no apparent nausea or vomiting Anesthetic complications: no   No notable events documented.  Last Vitals:  Vitals:   08/22/22 1030 08/22/22 1045  BP: (!) 151/77 (!) 168/81  Pulse: 68   Resp: 18   Temp:  36.5 C  SpO2: 97% 95%    Last Pain:  Vitals:   08/22/22 1045  TempSrc:   PainSc: 0-No pain                 Belenda Cruise P Darren Caldron

## 2022-08-22 NOTE — H&P (Signed)
History of Present Illness: Veronica Carter is a 61 y.o. female who is seen today as an office consultation for evaluation of New Consultation (Left breast ) .   Patient presents for evaluation of abnormal left breast mammogram. A 1.4 cm area of calcifications in the left breast overlapping sites was noted. Core biopsy showed papilloma without atypia. No history of nipple discharge, breast mass or breast pain except on the left side which has been present since 2020. No family history of breast cancer.  Review of Systems: A complete review of systems was obtained from the patient. I have reviewed this information and discussed as appropriate with the patient. See HPI as well for other ROS.    Medical History: Past Medical History:  Diagnosis Date  Hyperlipidemia  Hypertension   There is no problem list on file for this patient.  Past Surgical History:  Procedure Laterality Date  CESAREAN SECTION  [id number]  HYSTERECTOMY  SPINE SURGERY    Allergies  Allergen Reactions  Sulfa (Sulfonamide Antibiotics) Anaphylaxis  Codeine Hives and Swelling  Joints swelled and neck swelled  Morphine Itching  Burns her skin   Current Outpatient Medications on File Prior to Visit  Medication Sig Dispense Refill  hydroCHLOROthiazide (HYDRODIURIL) 25 MG tablet Take 25 mg by mouth every morning  olmesartan (BENICAR) 20 MG tablet Take 20 mg by mouth every morning  rosuvastatin (CRESTOR) 10 MG tablet Take 10 mg by mouth once daily   No current facility-administered medications on file prior to visit.   No family history on file.   Social History   Tobacco Use  Smoking Status Unknown  Smokeless Tobacco Not on file    Social History   Socioeconomic History  Marital status: Divorced  Tobacco Use  Smoking status: Unknown  Substance and Sexual Activity  Alcohol use: Not Currently  Drug use: Not Currently   Objective:   Vitals:  07/17/22 0935  BP: 118/87  Pulse: 93  Temp: 36.7 C  (98 F)  SpO2: 99%  Weight: (!) 105.7 kg (233 lb)   There is no height or weight on file to calculate BMI.  Physical Exam HENT:  Head: Normocephalic.  Cardiovascular:  Rate and Rhythm: Normal rate.  Pulmonary:  Effort: Pulmonary effort is normal.  Chest:  Breasts: Right: Normal. No swelling, mass or nipple discharge.  Left: Normal. No swelling, mass or nipple discharge.  Musculoskeletal:  General: Normal range of motion.  Cervical back: Normal range of motion.  Skin: General: Skin is warm.  Neurological:  General: No focal deficit present.  Mental Status: She is alert.    Labs, Imaging and Diagnostic Testing:  Diagnosis Breast, left, needle core biopsy, 3 o'clock, 3cmfn - INTRADUCTAL PAPILLOMA WITH SCLEROSING FIBROSIS. SEE NOTE - NEGATIVE FOR CARCINOMA  CLINICAL DATA: The patient was called back due to calcifications in a mass in the lateral central left breast.  EXAM: DIGITAL DIAGNOSTIC UNILATERAL LEFT MAMMOGRAM WITH TOMOSYNTHESIS; ULTRASOUND LEFT BREAST LIMITED  TECHNIQUE: Left digital diagnostic mammography and breast tomosynthesis was performed.; Targeted ultrasound examination of the left breast was performed.  COMPARISON: Previous exam(s).  ACR Breast Density Category b: There are scattered areas of fibroglandular density.  FINDINGS: The calcifying mass in the lateral central left breast remains.  Targeted ultrasound is performed, showing an oval mass measuring 14 x 4 x 4 mm at 3 o'clock, 3 cm from the nipple containing calcifications, likely correlating with the mammographic finding. There is a mildly abnormal lymph node in the left axilla containing a  biopsy clip. The previous biopsy demonstrated no evidence of malignancy. No other abnormal nodes are identified in the left axilla.  IMPRESSION: Indeterminate calcified mass in the left breast, likely a calcifying fibroadenoma or calcifying papilloma.  RECOMMENDATION: Recommend ultrasound-guided  biopsy of the left breast mass.  I have discussed the findings and recommendations with the patient. If applicable, a reminder letter will be sent to the patient regarding the next appointment.  BI-RADS CATEGORY 4: Suspicious  Assessment and Plan:   Diagnoses and all orders for this visit:  Papilloma of left breast    Discussed papilloma history, potential treatments including surgery and observation, and long-term impact of all. Given the size is greater than a centimeter, recommend left breast seed lumpectomy since her risk of atypia and/or malignancy go up in the setting. Risks and benefits of lumpectomy reviewed. Observation as well and the rationale for that and expected follow-up and long-term outcomes discussed. She is opted for left breast seed localized lumpectomy.The procedure has been discussed with the patient. Alternatives to surgery have been discussed with the patient. Risks of surgery include bleeding, Infection, Seroma formation, death, and the need for further surgery. The patient understands and wishes to proceed.   No follow-ups on file.  Kennieth Francois, MD

## 2022-08-22 NOTE — Anesthesia Preprocedure Evaluation (Addendum)
Anesthesia Evaluation  Patient identified by MRN, date of birth, ID band Patient awake    Reviewed: Allergy & Precautions, NPO status , Patient's Chart, lab work & pertinent test results  Airway Mallampati: II  TM Distance: >3 FB Neck ROM: Full    Dental no notable dental hx.    Pulmonary neg pulmonary ROS, Current Smoker,    Pulmonary exam normal        Cardiovascular hypertension, Pt. on medications  Rhythm:Regular Rate:Normal     Neuro/Psych negative neurological ROS  negative psych ROS   GI/Hepatic negative GI ROS, Neg liver ROS,   Endo/Other  negative endocrine ROS  Renal/GU negative Renal ROS  negative genitourinary   Musculoskeletal  (+) Arthritis , Osteoarthritis,  Left breast papilloma   Abdominal Normal abdominal exam  (+)   Peds  Hematology negative hematology ROS (+)     Anesthesia Other Findings   Reproductive/Obstetrics                            Anesthesia Physical Anesthesia Plan  ASA: 2  Anesthesia Plan: General   Post-op Pain Management: Celebrex PO (pre-op)*, Tylenol PO (pre-op)* and Gabapentin PO (pre-op)*   Induction: Intravenous  PONV Risk Score and Plan: 2 and Ondansetron, Dexamethasone, Midazolam and Treatment may vary due to age or medical condition  Airway Management Planned: Mask and LMA  Additional Equipment: None  Intra-op Plan:   Post-operative Plan: Extubation in OR  Informed Consent: I have reviewed the patients History and Physical, chart, labs and discussed the procedure including the risks, benefits and alternatives for the proposed anesthesia with the patient or authorized representative who has indicated his/her understanding and acceptance.     Dental advisory given  Plan Discussed with: CRNA  Anesthesia Plan Comments:       Anesthesia Quick Evaluation

## 2022-08-23 ENCOUNTER — Encounter (HOSPITAL_BASED_OUTPATIENT_CLINIC_OR_DEPARTMENT_OTHER): Payer: Self-pay | Admitting: Surgery

## 2022-08-23 LAB — SURGICAL PATHOLOGY

## 2022-08-24 ENCOUNTER — Encounter: Payer: Self-pay | Admitting: Surgery

## 2022-11-23 ENCOUNTER — Encounter (HOSPITAL_COMMUNITY): Payer: Self-pay

## 2023-02-28 ENCOUNTER — Emergency Department (HOSPITAL_BASED_OUTPATIENT_CLINIC_OR_DEPARTMENT_OTHER): Payer: BC Managed Care – PPO

## 2023-02-28 ENCOUNTER — Emergency Department (HOSPITAL_BASED_OUTPATIENT_CLINIC_OR_DEPARTMENT_OTHER)
Admission: EM | Admit: 2023-02-28 | Discharge: 2023-02-28 | Disposition: A | Discharge status: Home / Self Care | Payer: BC Managed Care – PPO | Attending: Emergency Medicine | Admitting: Emergency Medicine

## 2023-02-28 ENCOUNTER — Encounter (HOSPITAL_BASED_OUTPATIENT_CLINIC_OR_DEPARTMENT_OTHER): Payer: Self-pay | Admitting: Emergency Medicine

## 2023-02-28 ENCOUNTER — Other Ambulatory Visit: Payer: Self-pay

## 2023-02-28 DIAGNOSIS — R079 Chest pain, unspecified: Secondary | ICD-10-CM | POA: Insufficient documentation

## 2023-02-28 DIAGNOSIS — R1013 Epigastric pain: Secondary | ICD-10-CM | POA: Diagnosis not present

## 2023-02-28 LAB — CBC WITH DIFFERENTIAL/PLATELET
Abs Immature Granulocytes: 0.03 10*3/uL (ref 0.00–0.07)
Basophils Absolute: 0.1 10*3/uL (ref 0.0–0.1)
Basophils Relative: 1 %
Eosinophils Absolute: 0.3 10*3/uL (ref 0.0–0.5)
Eosinophils Relative: 4 %
HCT: 40.1 % (ref 36.0–46.0)
Hemoglobin: 12.9 g/dL (ref 12.0–15.0)
Immature Granulocytes: 0 %
Lymphocytes Relative: 37 %
Lymphs Abs: 3 10*3/uL (ref 0.7–4.0)
MCH: 29 pg (ref 26.0–34.0)
MCHC: 32.2 g/dL (ref 30.0–36.0)
MCV: 90.1 fL (ref 80.0–100.0)
Monocytes Absolute: 0.6 10*3/uL (ref 0.1–1.0)
Monocytes Relative: 7 %
Neutro Abs: 4.1 10*3/uL (ref 1.7–7.7)
Neutrophils Relative %: 51 %
Platelets: 232 10*3/uL (ref 150–400)
RBC: 4.45 MIL/uL (ref 3.87–5.11)
RDW: 14.9 % (ref 11.5–15.5)
WBC: 8 10*3/uL (ref 4.0–10.5)
nRBC: 0 % (ref 0.0–0.2)

## 2023-02-28 LAB — COMPREHENSIVE METABOLIC PANEL
ALT: 23 U/L (ref 0–44)
AST: 22 U/L (ref 15–41)
Albumin: 4.2 g/dL (ref 3.5–5.0)
Alkaline Phosphatase: 98 U/L (ref 38–126)
Anion gap: 8 (ref 5–15)
BUN: 15 mg/dL (ref 8–23)
CO2: 27 mmol/L (ref 22–32)
Calcium: 8.8 mg/dL — ABNORMAL LOW (ref 8.9–10.3)
Chloride: 101 mmol/L (ref 98–111)
Creatinine, Ser: 0.81 mg/dL (ref 0.44–1.00)
GFR, Estimated: >60 mL/min (ref >60)
Glucose, Bld: 133 mg/dL — ABNORMAL HIGH (ref 70–99)
Potassium: 3.5 mmol/L (ref 3.5–5.1)
Sodium: 136 mmol/L (ref 135–145)
Total Bilirubin: 0.2 mg/dL — ABNORMAL LOW (ref 0.3–1.2)
Total Protein: 7.8 g/dL (ref 6.5–8.1)

## 2023-02-28 LAB — TROPONIN I (HIGH SENSITIVITY)
Troponin I (High Sensitivity): <2 ng/L (ref <18)
Troponin I (High Sensitivity): <2 ng/L (ref <18)

## 2023-02-28 LAB — LIPASE, BLOOD: Lipase: 41 U/L (ref 11–51)

## 2023-02-28 MED ORDER — ALUM & MAG HYDROXIDE-SIMETH 200-200-20 MG/5ML PO SUSP
30.0000 mL | Freq: Once | ORAL | Status: AC
  Administered 2023-02-28: 30 mL via ORAL
  Filled 2023-02-28: qty 30

## 2023-02-28 MED ORDER — PANTOPRAZOLE SODIUM 40 MG PO TBEC: 40.0000 mg | DELAYED_RELEASE_TABLET | Freq: Every day | ORAL | 0 refills | Status: AC

## 2023-02-28 MED ORDER — FAMOTIDINE IN NACL 20-0.9 MG/50ML-% IV SOLN
20.0000 mg | Freq: Once | INTRAVENOUS | Status: AC
  Administered 2023-02-28: 20 mg via INTRAVENOUS
  Filled 2023-02-28: qty 50

## 2023-02-28 NOTE — Discharge Instructions (Addendum)
You are seen today for chest pain.  Your heart tests were reassuring.  Symptoms may have been due to acid reflux so we are to start you on Protonix.  Follow-up with your primary care doctor as well as your cardiologist.  Come back to the ER if you have new or worsening symptoms.

## 2023-02-28 NOTE — ED Provider Notes (Signed)
[facility] Provider Note   CSN: [id number] Arrival date & time: 02/28/23  1132     History  Chief Complaint  Patient presents with   Chest Pain    Veronica Carter is a 62 y.o. female.  The ER complaining of epigastric and chest pain that started up about lunchtime when she was eating buffalo wings.  She states she had nausea with it as well denies radiation of pain, sweating, dizziness, presyncope or syncope, shortness of breath.  Denies exertional symptoms or any other complaints.  She did not take any medications for her symptoms.  She does have history of high blood pressure and high cholesterol, follows with cardiology for this.   Chest Pain      Home Medications Prior to Admission medications   Medication Sig Start Date End Date Taking? Authorizing Provider  cetirizine (ZYRTEC) 10 MG tablet Take 10 mg by mouth every evening.   Yes [provider]  furosemide (LASIX) 20 MG tablet Take 20 mg by mouth daily as needed for fluid or edema. 12/22/22  Yes [provider]  hydrochlorothiazide (HYDRODIURIL) 25 MG tablet Take 1 tablet (25 mg total) by mouth daily. 11/29/15  Yes Mabe, Onalee Hua, NP  olmesartan (BENICAR) 20 MG tablet Take 1 tablet (20 mg total) by mouth daily. 06/28/21  Yes Chilton Si, MD  pantoprazole (PROTONIX) 40 MG tablet Take 1 tablet (40 mg total) by mouth daily. 02/28/23  Yes Tangie Stay A, PA-C  rosuvastatin (CRESTOR) 10 MG tablet Take 1 tablet (10 mg total) by mouth daily. 06/28/21 02/28/23 Yes Chilton Si, MD  ibuprofen (ADVIL) 800 MG tablet Take 1 tablet (800 mg total) by mouth every 8 (eight) hours as needed. Patient not taking: Reported on 02/28/2023 08/22/22   Harriette Bouillon, MD  ibuprofen (ADVIL,MOTRIN) 800 MG tablet Take 1 tablet (800 mg total) by mouth every 8 (eight) hours as needed for headache or moderate pain. Patient not taking: Reported on 02/28/2023 04/21/14   Elwin Mocha, MD       Allergies    Sulfa antibiotics, Codeine, Hydrocodone, Morphine and related, Tramadol, Tylenol [acetaminophen], and Demerol [meperidine]    Review of Systems   Review of Systems  Cardiovascular:  Positive for chest pain.    Physical Exam Updated Vital Signs BP 126/66   Pulse 67   Temp 97.8 F (36.6 C) (Oral)   Resp 17   Ht  (1.676 m)   Wt 106.1 kg   SpO2 100%   BMI 37.77 kg/m  Physical Exam Vitals and nursing note reviewed.  Constitutional:      General: She is not in acute distress.    Appearance: She is well-developed.  HENT:     Head: Normocephalic and atraumatic.  Eyes:     Conjunctiva/sclera: Conjunctivae normal.  Cardiovascular:     Rate and Rhythm: Normal rate and regular rhythm.     Heart sounds: No murmur heard. Pulmonary:     Effort: Pulmonary effort is normal. No respiratory distress.     Breath sounds: Normal breath sounds.  Abdominal:     Palpations: Abdomen is soft.     Tenderness: There is abdominal tenderness in the epigastric area. There is no right CVA tenderness, left CVA tenderness, guarding or rebound. Negative signs include Murphy's sign and McBurney's sign.    Musculoskeletal:        General: No swelling. Normal range of motion.     Cervical back: Neck supple.  Right lower leg: No edema.     Left lower leg: No edema.  Skin:    General: Skin is warm and dry.     Capillary Refill: Capillary refill takes less than 2 seconds.  Neurological:     General: No focal deficit present.     Mental Status: She is alert and oriented to person, place, and time.     Motor: No weakness.  Psychiatric:        Mood and Affect: Mood normal.     ED Results / Procedures / Treatments   Labs (all labs ordered are listed, but only abnormal results are displayed) Labs Reviewed  COMPREHENSIVE METABOLIC PANEL - Abnormal; Notable for the following components:      Result Value   Glucose, Bld 133 (*)    Calcium 8.8 (*)    Total Bilirubin 0.2 (*)     All other components within normal limits  CBC WITH DIFFERENTIAL/PLATELET  LIPASE, BLOOD  TROPONIN I (HIGH SENSITIVITY)  TROPONIN I (HIGH SENSITIVITY)    EKG EKG Interpretation  Date/Time:  Wednesday February 28 2023 11:47:41 EDT Ventricular Rate:  71 PR Interval:  185 QRS Duration: 113 QT Interval:  405 QTC Calculation: 441 R Axis:   -19 Text Interpretation: Sinus rhythm Probable left atrial enlargement Left ventricular hypertrophy Anterior Q waves, possibly due to LVH Confirmed by Vonita Moss [phone]) on 02/28/2023 1:08:36 PM  Radiology DG Chest 2 View  Result Date: 02/28/2023 CLINICAL DATA:  LEFT chest pain radiating to epigastric region for 2 hours EXAM: CHEST - 2 VIEW COMPARISON:  06/07/2016 FINDINGS: Normal heart size, mediastinal contours, and pulmonary vascularity. Lungs clear. No pleural effusion or pneumothorax. Prior cervical spine fusion. IMPRESSION: No acute abnormalities. Electronically Signed   By: Ulyses Southward M.D.   On: 02/28/2023 12:26    Procedures Procedures    Medications Ordered in ED Medications  famotidine (PEPCID) IVPB 20 mg premix (0 mg Intravenous Stopped 02/28/23 1259)  alum & mag hydroxide-simeth (MAALOX/MYLANTA) 200-200-20 MG/5ML suspension 30 mL (30 mLs Oral Given 02/28/23 1258)    ED Course/ Medical Decision Making/ A&P             HEART Score: 3                Medical Decision Making The patient presented today for chest pain that had started about 2 hours prior to arrival. EKG showed normal sinus rhythm, no ischemic changes, Chest Xray was independently reviewed by me and shows no acute cardiopulmonary disease. I agree with radiology interpretation.   They were given GI cocktail and Pepcid for their symptoms, and on repeat evaluation their symptoms are resolved  I considered a broad differential including but not limited to ACS, PE, Dissection, pneumothorax, costochondritis, pneumonia, GERD, pericarditis, and pericardial effusion.  PE  is considered low risk on Wells criteria, symptoms are most likely due to GERD I feel disssection is extremely unlikely  Symptoms and EKG are not consisted with pericardial effusion  Their heart score is 3 (low risk). Troponins not elevated  Patient pain is primarily epigastric after eating buffalo wings,  Plan is for start PPI, follow-up with PCP and cardiology.  Advised on return precautions   Amount and/or Complexity of Data Reviewed Labs: ordered. Radiology: ordered.  Risk OTC drugs. Prescription drug management.           Final Clinical Impression(s) / ED Diagnoses Final diagnoses:  Chest pain, unspecified type    Rx / DC Orders ED Discharge  Orders          Ordered    pantoprazole (PROTONIX) 40 MG tablet  Daily        02/28/23 1443              Josem Kaufmann 02/28/23 1806    Rondel Baton, MD 03/03/23 1019

## 2023-02-28 NOTE — ED Notes (Signed)
Discharge instructions reviewed with patient. Patient verbalizes understanding, no further questions at this time. Medications/prescriptions and follow up information provided. No acute distress noted at time of departure.  

## 2023-02-28 NOTE — ED Triage Notes (Signed)
Pt c/o LT CP that started 2 hrs ago; sts feels like indigestion, but is worsening; denies other sxs

## 2023-03-12 IMAGING — MG DIGITAL DIAGNOSTIC BILAT W/ TOMO W/ CAD
8 series · 8 of 24 positions shown · non-contrast
Comparison: Previous exam(s).

CLINICAL DATA: Patient presents complaining of bilateral breast
pain, left greater than right. She was evaluated for breast pain at
[facility] in April 2020, subsequently undergoing a biopsy of left
axillary lymph node with benign results.

EXAM:
DIGITAL DIAGNOSTIC BILATERAL MAMMOGRAM WITH TOMOSYNTHESIS AND CAD
TECHNIQUE: Bilateral digital diagnostic mammography and breast tomosynthesis
was performed. The images were evaluated with computer-aided
detection.

[R MLO synth-2D]
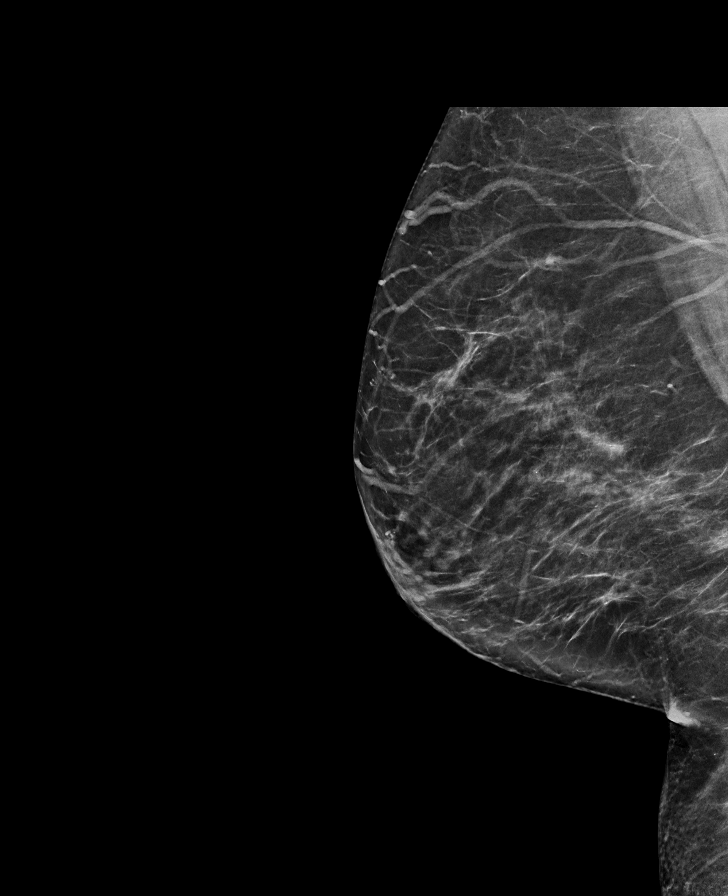

[L MLO synth-2D]
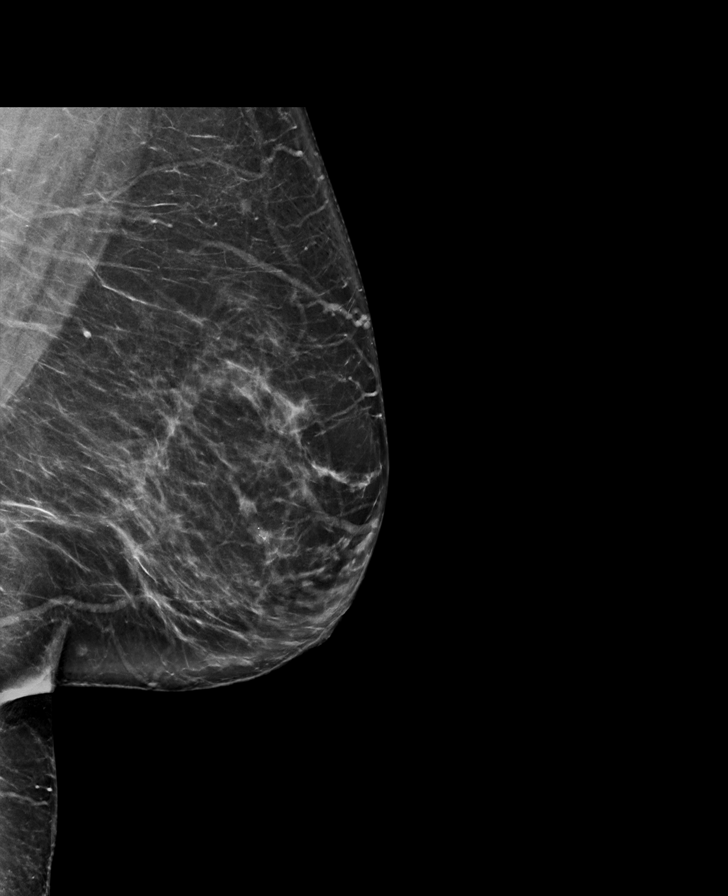

[L CC synth-2D]
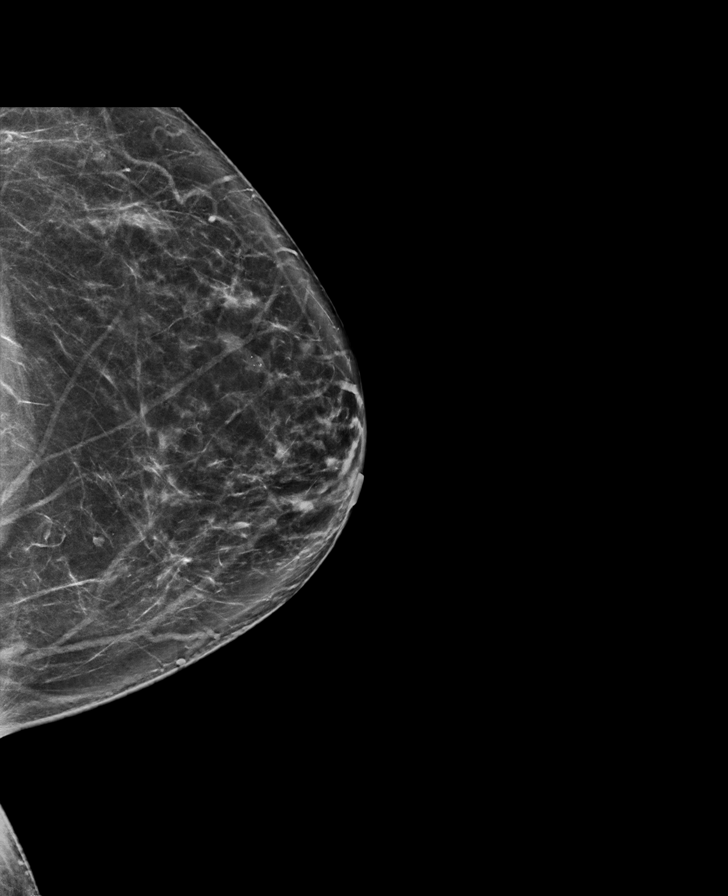

[R CC synth-2D]
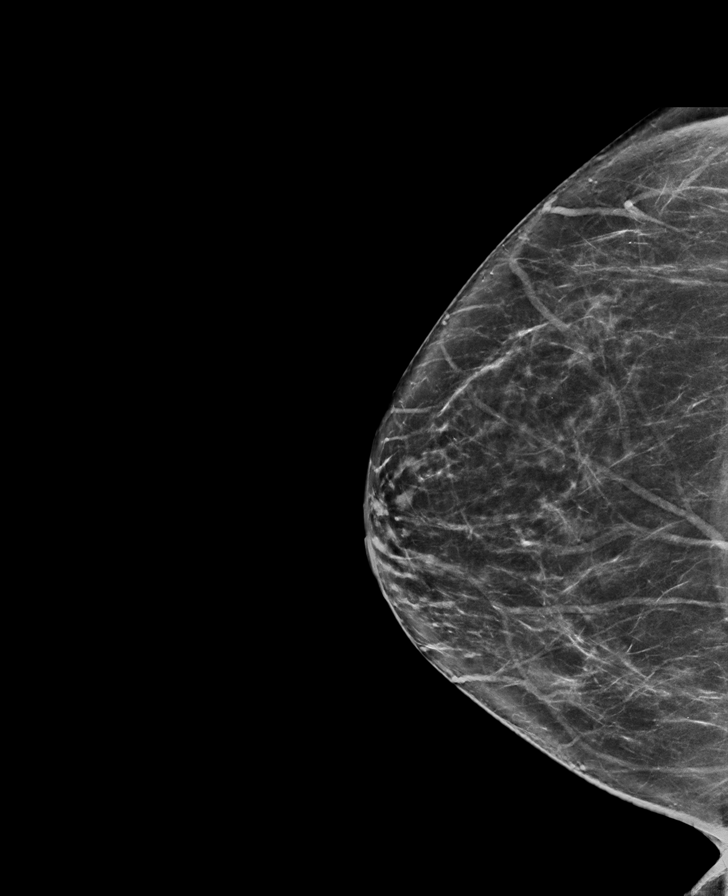

[L MLO tomo · tomo slice 43/84.0]
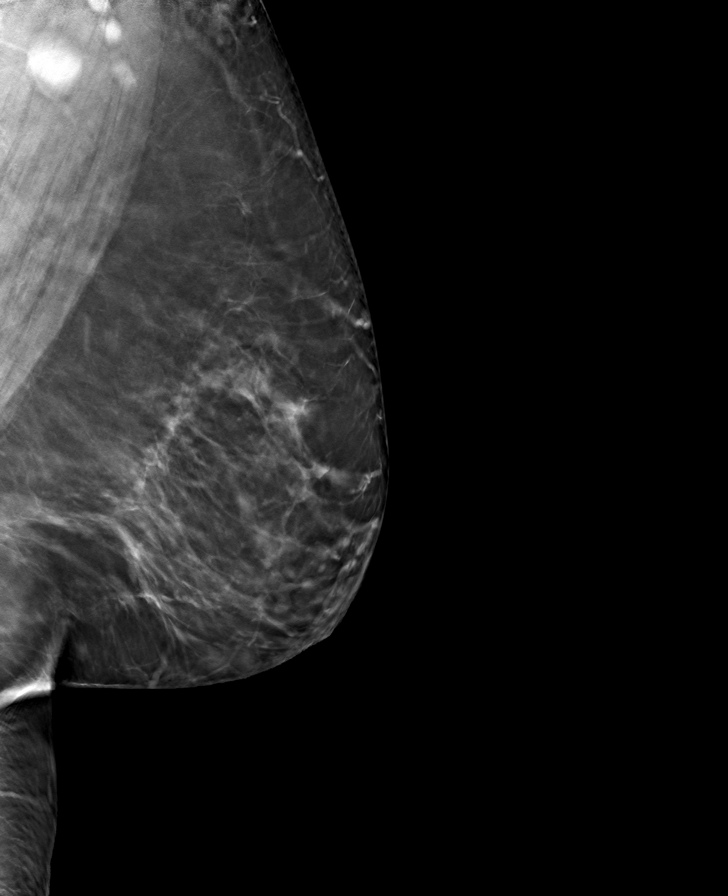

[R MLO tomo · tomo slice 40/79.0]
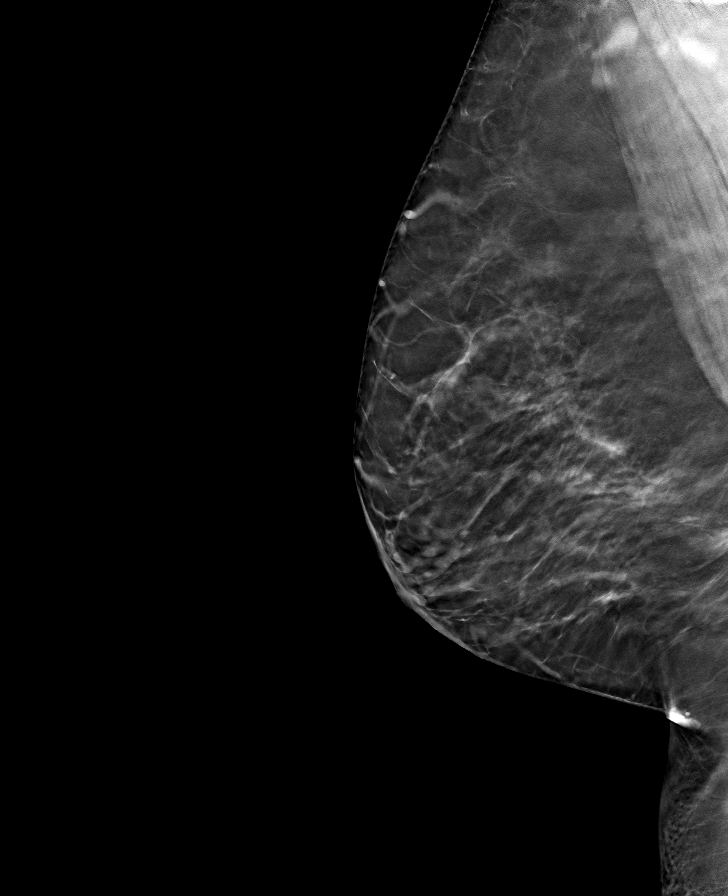

[L CC tomo · tomo slice 42/83.0]
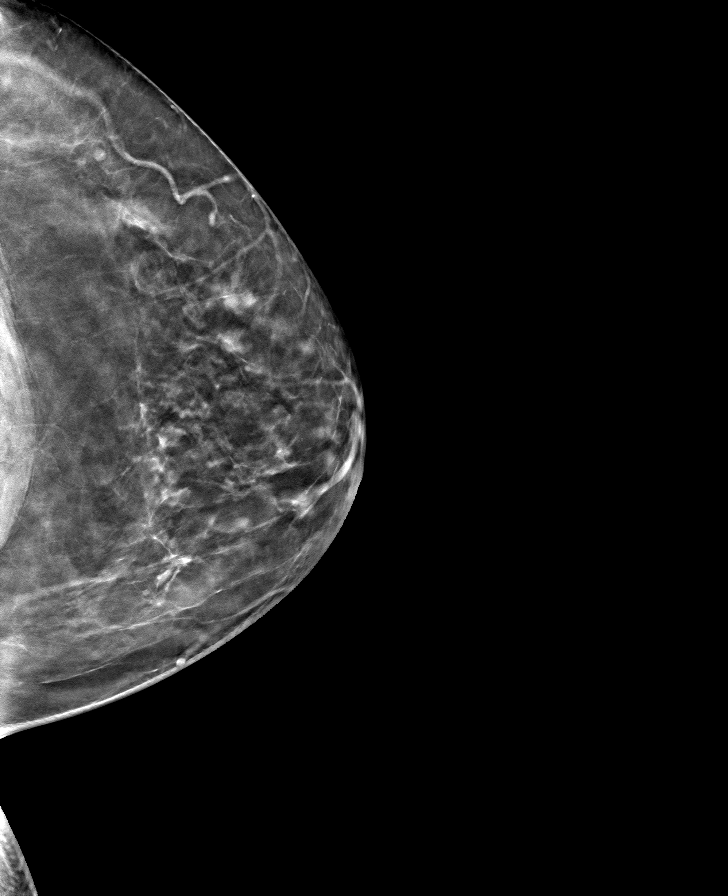

[R CC tomo · tomo slice 40/79.0]
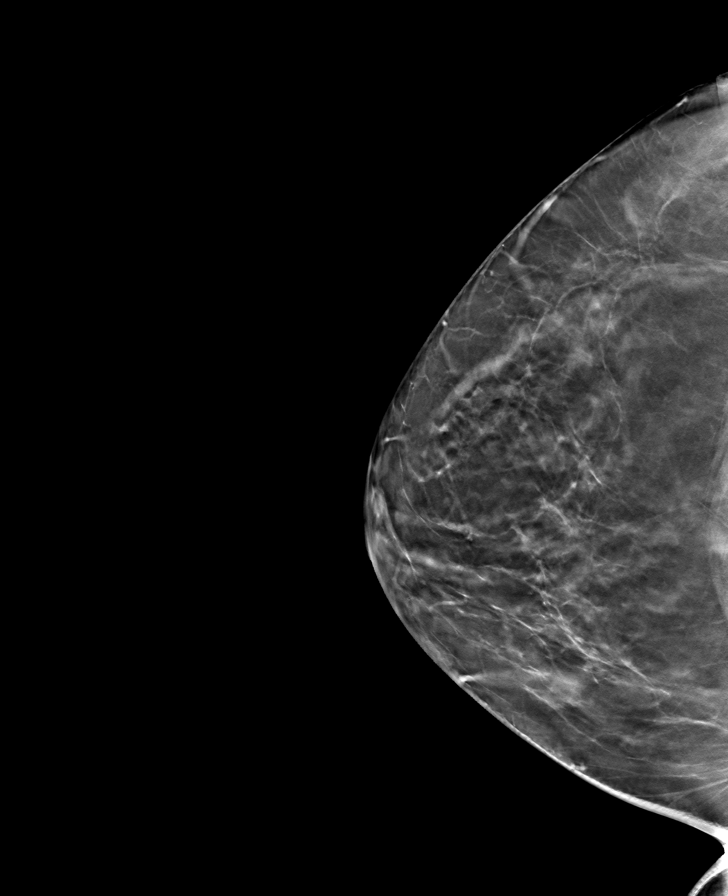

[8 of 24 positions shown; findings below may reference images not displayed]

ACR Breast Density Category b: There are scattered areas of
fibroglandular density.
FINDINGS: There are no suspicious masses, areas of architectural distortion,
areas of significant asymmetry or suspicious calcifications. A post
biopsy clip lies in a prominent left axillary lymph node.
IMPRESSION: 1. No evidence of breast malignancy.

RECOMMENDATION:
Screening mammogram in one year.(Code:XN-I-01F)

I have discussed the findings and recommendations with the patient.
If applicable, a reminder letter will be sent to the patient
regarding the next appointment.

BI-RADS CATEGORY  1: Negative.

## 2023-07-02 ENCOUNTER — Other Ambulatory Visit: Payer: Self-pay | Admitting: Family Medicine

## 2023-07-02 DIAGNOSIS — Z9889 Other specified postprocedural states: Secondary | ICD-10-CM

## 2023-07-31 ENCOUNTER — Other Ambulatory Visit (HOSPITAL_BASED_OUTPATIENT_CLINIC_OR_DEPARTMENT_OTHER): Payer: Self-pay | Admitting: Cardiovascular Disease

## 2023-07-31 DIAGNOSIS — I10 Essential (primary) hypertension: Secondary | ICD-10-CM

## 2023-07-31 NOTE — Telephone Encounter (Signed)
Patient not seen since 2022, routing to provider for review.

## 2023-08-02 ENCOUNTER — Ambulatory Visit
Admission: RE | Admit: 2023-08-02 | Discharge: 2023-08-02 | Disposition: A | Discharge status: Home / Self Care | Payer: BC Managed Care – PPO | Source: Ambulatory Visit | Attending: Family Medicine | Admitting: Family Medicine

## 2023-08-02 DIAGNOSIS — Z9889 Other specified postprocedural states: Secondary | ICD-10-CM

## 2023-09-17 ENCOUNTER — Other Ambulatory Visit (HOSPITAL_BASED_OUTPATIENT_CLINIC_OR_DEPARTMENT_OTHER): Payer: Self-pay | Admitting: Cardiovascular Disease

## 2023-09-17 DIAGNOSIS — I10 Essential (primary) hypertension: Secondary | ICD-10-CM

## 2024-07-25 ENCOUNTER — Other Ambulatory Visit: Payer: Self-pay | Admitting: Family Medicine

## 2024-07-25 DIAGNOSIS — Z0000 Encounter for general adult medical examination without abnormal findings: Secondary | ICD-10-CM

## 2024-08-06 ENCOUNTER — Ambulatory Visit
Admission: RE | Admit: 2024-08-06 | Discharge: 2024-08-06 | Disposition: A | Discharge status: Home / Self Care | Source: Ambulatory Visit | Attending: Family Medicine | Admitting: Family Medicine

## 2024-08-06 DIAGNOSIS — Z0000 Encounter for general adult medical examination without abnormal findings: Secondary | ICD-10-CM

## 2024-08-11 ENCOUNTER — Other Ambulatory Visit: Payer: Self-pay | Admitting: Family Medicine

## 2024-08-11 DIAGNOSIS — R928 Other abnormal and inconclusive findings on diagnostic imaging of breast: Secondary | ICD-10-CM

## 2024-08-19 ENCOUNTER — Ambulatory Visit
Admission: RE | Admit: 2024-08-19 | Discharge: 2024-08-19 | Disposition: A | Discharge status: Home / Self Care | Source: Ambulatory Visit | Attending: Family Medicine | Admitting: Family Medicine

## 2024-08-19 ENCOUNTER — Other Ambulatory Visit: Payer: Self-pay | Admitting: Family Medicine

## 2024-08-19 ENCOUNTER — Ambulatory Visit
Admission: RE | Admit: 2024-08-19 | Discharge: 2024-08-19 | Disposition: A | Source: Ambulatory Visit | Attending: Family Medicine | Admitting: Family Medicine

## 2024-08-19 DIAGNOSIS — R928 Other abnormal and inconclusive findings on diagnostic imaging of breast: Secondary | ICD-10-CM

## 2024-08-19 DIAGNOSIS — N6311 Unspecified lump in the right breast, upper outer quadrant: Secondary | ICD-10-CM

## 2024-08-22 ENCOUNTER — Ambulatory Visit
Admission: RE | Admit: 2024-08-22 | Discharge: 2024-08-22 | Disposition: A | Source: Ambulatory Visit | Attending: Family Medicine | Admitting: Family Medicine

## 2024-08-22 ENCOUNTER — Ambulatory Visit
Admission: RE | Admit: 2024-08-22 | Discharge: 2024-08-22 | Disposition: A | Discharge status: Home / Self Care | Source: Ambulatory Visit | Attending: Family Medicine | Admitting: Family Medicine

## 2024-08-22 DIAGNOSIS — R928 Other abnormal and inconclusive findings on diagnostic imaging of breast: Secondary | ICD-10-CM

## 2024-08-22 DIAGNOSIS — N6311 Unspecified lump in the right breast, upper outer quadrant: Secondary | ICD-10-CM

## 2024-08-22 HISTORY — PX: BREAST BIOPSY: SHX20

## 2024-08-25 LAB — SURGICAL PATHOLOGY
